# Patient Record
Sex: Male | Born: 1965
Health system: Southern US, Community
[De-identification: ages and names within clinical notes are randomized; demographics above are authoritative.]

## PROBLEM LIST (undated history)

## (undated) DIAGNOSIS — J302 Other seasonal allergic rhinitis: Secondary | ICD-10-CM

## (undated) DIAGNOSIS — I10 Essential (primary) hypertension: Secondary | ICD-10-CM

## (undated) DIAGNOSIS — K219 Gastro-esophageal reflux disease without esophagitis: Secondary | ICD-10-CM

## (undated) DIAGNOSIS — T7840XA Allergy, unspecified, initial encounter: Secondary | ICD-10-CM

## (undated) DIAGNOSIS — H409 Unspecified glaucoma: Secondary | ICD-10-CM

## (undated) DIAGNOSIS — M1990 Unspecified osteoarthritis, unspecified site: Secondary | ICD-10-CM

## (undated) DIAGNOSIS — F419 Anxiety disorder, unspecified: Secondary | ICD-10-CM

## (undated) HISTORY — PX: WISDOM TOOTH EXTRACTION: SHX21

## (undated) HISTORY — DX: Allergy, unspecified, initial encounter: T78.40XA

## (undated) HISTORY — DX: Unspecified glaucoma: H40.9

## (undated) HISTORY — PX: SINUS ENDO W/FUSION: SHX777

---

## 2000-09-03 ENCOUNTER — Emergency Department (HOSPITAL_COMMUNITY): Admission: EM | Admit: 2000-09-03 | Discharge: 2000-09-03 | Payer: Self-pay | Admitting: Emergency Medicine

## 2000-09-07 ENCOUNTER — Encounter: Payer: Self-pay | Admitting: Orthopedic Surgery

## 2000-09-07 ENCOUNTER — Ambulatory Visit (HOSPITAL_COMMUNITY): Admission: RE | Admit: 2000-09-07 | Discharge: 2000-09-07 | Payer: Self-pay | Admitting: Orthopedic Surgery

## 2005-03-25 ENCOUNTER — Encounter: Admission: RE | Admit: 2005-03-25 | Discharge: 2005-03-25 | Payer: Self-pay | Admitting: Allergy and Immunology

## 2009-04-28 ENCOUNTER — Emergency Department (HOSPITAL_COMMUNITY): Admission: EM | Admit: 2009-04-28 | Discharge: 2009-04-28 | Payer: Self-pay | Admitting: Family Medicine

## 2009-09-28 ENCOUNTER — Emergency Department (HOSPITAL_COMMUNITY): Admission: EM | Admit: 2009-09-28 | Discharge: 2009-09-28 | Payer: Self-pay | Admitting: Family Medicine

## 2010-07-06 ENCOUNTER — Emergency Department (HOSPITAL_COMMUNITY): Admission: EM | Admit: 2010-07-06 | Discharge: 2010-07-06 | Payer: Self-pay | Admitting: Family Medicine

## 2011-01-24 LAB — POCT RAPID STREP A (OFFICE): Streptococcus, Group A Screen (Direct): NEGATIVE

## 2012-01-24 ENCOUNTER — Encounter (HOSPITAL_COMMUNITY): Payer: Self-pay | Admitting: *Deleted

## 2012-01-24 ENCOUNTER — Emergency Department (HOSPITAL_COMMUNITY): Payer: 59

## 2012-01-24 ENCOUNTER — Emergency Department (HOSPITAL_COMMUNITY)
Admission: EM | Admit: 2012-01-24 | Discharge: 2012-01-24 | Disposition: A | Discharge status: Home / Self Care | Payer: 59 | Attending: Emergency Medicine | Admitting: Emergency Medicine

## 2012-01-24 DIAGNOSIS — R220 Localized swelling, mass and lump, head: Secondary | ICD-10-CM | POA: Insufficient documentation

## 2012-01-24 DIAGNOSIS — X58XXXA Exposure to other specified factors, initial encounter: Secondary | ICD-10-CM | POA: Insufficient documentation

## 2012-01-24 DIAGNOSIS — Z79899 Other long term (current) drug therapy: Secondary | ICD-10-CM | POA: Insufficient documentation

## 2012-01-24 DIAGNOSIS — I10 Essential (primary) hypertension: Secondary | ICD-10-CM | POA: Insufficient documentation

## 2012-01-24 DIAGNOSIS — R070 Pain in throat: Secondary | ICD-10-CM | POA: Insufficient documentation

## 2012-01-24 DIAGNOSIS — R1310 Dysphagia, unspecified: Secondary | ICD-10-CM | POA: Insufficient documentation

## 2012-01-24 DIAGNOSIS — M542 Cervicalgia: Secondary | ICD-10-CM | POA: Insufficient documentation

## 2012-01-24 DIAGNOSIS — T783XXA Angioneurotic edema, initial encounter: Secondary | ICD-10-CM

## 2012-01-24 HISTORY — DX: Essential (primary) hypertension: I10

## 2012-01-24 LAB — DIFFERENTIAL
Eosinophils Absolute: 0.3 10*3/uL (ref 0.0–0.7)
Lymphocytes Relative: 31 % (ref 12–46)
Lymphs Abs: 1.8 10*3/uL (ref 0.7–4.0)
Monocytes Relative: 8 % (ref 3–12)
Neutrophils Relative %: 55 % (ref 43–77)

## 2012-01-24 LAB — POCT I-STAT, CHEM 8
BUN: 12 mg/dL (ref 6–23)
Chloride: 106 mEq/L (ref 96–112)
Creatinine, Ser: 0.7 mg/dL (ref 0.50–1.35)
Glucose, Bld: 104 mg/dL — ABNORMAL HIGH (ref 70–99)
Hemoglobin: 15 g/dL (ref 13.0–17.0)
Potassium: 4 mEq/L (ref 3.5–5.1)
Sodium: 143 mEq/L (ref 135–145)

## 2012-01-24 LAB — CBC
Hemoglobin: 14.6 g/dL (ref 13.0–17.0)
MCH: 29.6 pg (ref 26.0–34.0)
Platelets: 201 10*3/uL (ref 150–400)
RBC: 4.93 MIL/uL (ref 4.22–5.81)
WBC: 5.7 10*3/uL (ref 4.0–10.5)

## 2012-01-24 MED ORDER — AMPICILLIN-SULBACTAM SODIUM 3 (2-1) G IJ SOLR
3.0000 g | Freq: Once | INTRAMUSCULAR | Status: AC
  Administered 2012-01-24: 3 g via INTRAVENOUS
  Filled 2012-01-24: qty 3

## 2012-01-24 MED ORDER — FAMOTIDINE 20 MG PO TABS: 20.0000 mg | ORAL_TABLET | Freq: Two times a day (BID) | ORAL | Status: DC

## 2012-01-24 MED ORDER — SODIUM CHLORIDE 0.9 % IV BOLUS (SEPSIS)
1000.0000 mL | Freq: Once | INTRAVENOUS | Status: AC
  Administered 2012-01-24: 1000 mL via INTRAVENOUS

## 2012-01-24 MED ORDER — IOHEXOL 300 MG/ML  SOLN
75.0000 mL | Freq: Once | INTRAMUSCULAR | Status: AC | PRN
  Administered 2012-01-24: 75 mL via INTRAVENOUS

## 2012-01-24 MED ORDER — PREDNISONE 10 MG PO TABS: 40.0000 mg | ORAL_TABLET | Freq: Every day | ORAL | Status: DC

## 2012-01-24 MED ORDER — FAMOTIDINE IN NACL 20-0.9 MG/50ML-% IV SOLN
20.0000 mg | Freq: Once | INTRAVENOUS | Status: AC
  Administered 2012-01-24: 20 mg via INTRAVENOUS
  Filled 2012-01-24: qty 50

## 2012-01-24 MED ORDER — ACETAMINOPHEN 325 MG PO TABS
650.0000 mg | ORAL_TABLET | Freq: Once | ORAL | Status: AC
  Administered 2012-01-24: 650 mg via ORAL
  Filled 2012-01-24: qty 2

## 2012-01-24 MED ORDER — DEXAMETHASONE SODIUM PHOSPHATE 10 MG/ML IJ SOLN
10.0000 mg | Freq: Once | INTRAMUSCULAR | Status: AC
  Administered 2012-01-24: 10 mg via INTRAVENOUS
  Filled 2012-01-24: qty 1

## 2012-01-24 NOTE — ED Provider Notes (Signed)
Medical screening examination/treatment/procedure(s) were conducted as a shared visit with non-physician practitioner(s) and myself.  I personally evaluated the patient during the encounter  Cheri Guppy, MD 01/24/12 [phone]

## 2012-01-24 NOTE — ED Provider Notes (Signed)
Medical screening examination/treatment/procedure(s) were conducted as a shared visit with non-physician practitioner(s) and myself.  I personally evaluated the patient during the encounter 46 year old, male, complains of sore throat, and dysphagia, since yesterday.  He has not had a fever.  No cough, or respiratory distress.  On examination.  His oropharynx is clear with no signs of exudate or edema.  CT is negative.  While he sitting up.  He does not have any symptoms but when he laid down.  She feels as if he is unable to breathe.  He has a very fat, neck.  This may be a component of obstruction due to the weight of his neck.  We will continue to monitor him in the CDU.  Cheri Guppy, MD 01/24/12 [phone]

## 2012-01-24 NOTE — ED Notes (Signed)
Patient transported to CT 

## 2012-01-24 NOTE — Discharge Instructions (Signed)
Angioedema Angioedema (AE) is a sudden swelling of the eyelids, lips, lobes of ears, external genitalia, skin, and other parts of the body. AE can happen by itself. It usually begins during the night and is found on awakening. It can happen with hives and other allergic reactions. Attacks can be mild and annoying, or life-threatening if the air passages swell. AE generally occurs in a short time period (over minutes to hours) and gets better in 24 to 48 hours. It usually does not cause any serious problems.  There are 2 different kinds of AE:   Allergic AE.   Nonallergic AE.   There may be an overreaction or direct stimulation of cells that are a part of the immune system (mast cells).   There may be problems with the release of chemicals made by the body that cause swelling and inflammation (kinins). AE due to kinins can be inherited from parents (hereditary), or it can develop on its own (acquired). Acquired AE either shows up before, or along with, certain diseases or is due to the body's immune system attacking parts of the body's own cells (autoimmune).  CAUSES  Allergic  AE due to allergic reactions are caused by something that causes the body to react (trigger). Common triggers include:   Foods.   Medicines.   Latex.   Direct contact with certain fruits, vegetables, or animal saliva.   Insect stings.  Nonallergic  Mast cell stimulation may be caused by:   Medicines.   Dyes used in X-rays.   The body's own immune system reactions to parts of the body (autoimmune disease).   Possibly, some virus infections.   AE due to problems with kinins can be hereditary or acquired. Attacks are triggered by:   Mild injury.   Dental work or any surgery.   Stress.   Sudden changes in temperature.   Exercise.   Medicines.   AE due to problems with kinins can also be due to certain medicines, especially blood pressure medicines like angiotensin-converting enzyme (ACE)  inhibitors. African Americans are at nearly 5 times greater risk of developing AE than Caucasians from ACE inhibitors.  SYMPTOMS  Allergic symptoms:  Non-itchy swelling of the skin. Often the swelling is on the face and lips, but any area of the skin can swell. Sometimes, the swelling can be painful. If hives are present, there is intense itching.   Breathing problems if the air passages swell.  Nonallergic symptoms:  If internal organs are involved, there may be:   Nausea.   Abdominal pain.   Vomiting.   Difficulty swallowing.   Difficulty passing urine.   Breathing problems if the air passages swell.  Depending on the cause of AE, episodes may:  Only happen once (if triggers are removed or avoided).   Come back in unpredictable patterns.   Repeat for several years and then gradually fade away.  DIAGNOSIS  AE is diagnosed by:   Asking questions to find out how fast the symptoms began.   Taking a family history.   Physical exam.   Diagnostic tests. Tests could include:   Allergy skin tests to see if the problem is allergic.   Blood tests to diagnose hereditary and some acquired types of AE.   Other tests to see if there is a hidden disease leading to the AE.  TREATMENT  Treatment depends on the type and cause (if any) of the AE. Allergic  Allergic types of AE are treated with:   Immediate removal of   the trigger or medicine (if any).   Epinephrine injection.   Steroids.   Antihistamines.   Hospitalization for severe attacks.  Nonallergic  Mast cell stimulation types of AE are treated with:   Immediate removal of the trigger or medicine (if any).   Epinephrine injection.   Steroids.   Antihistamines.   Hospitalization for severe attacks.   Hereditary AE is treated with:   Medicines to prevent and treat attacks. There is little response to antihistamines, epinephrine, or steroids.   Preventive medicines before dental work or surgery.    Removing or avoiding medicines that trigger attacks.   Hospitalization for severe attacks.   Acquired AE is treated with:   Treating underlying disease (if any).   Medicines to prevent and treat attacks.  HOME CARE INSTRUCTIONS   Always carry your emergency allergy treatment medicines with you.   Wear a medical bracelet.   Avoid known triggers.  SEEK MEDICAL CARE IF:   You get repeat attacks.   Your attacks are more frequent or more severe despite preventive measures.   You have hereditary AE and are considering having children. It is important to discuss the risks of passing this on to your children.  SEEK IMMEDIATE MEDICAL CARE IF:   You have difficulty breathing.   You have difficulty swallowing.   You experience fainting.  This condition should be treated immediately. It can be life-threatening if it involves throat swelling. Document Released: 01/05/2002 Document Revised: 10/16/2011 Document Reviewed: 10/26/2008 [employer] Patient Information 2012 [employer]. 

## 2012-01-24 NOTE — ED Notes (Signed)
Pt reporting feeling difficulty with breathing when laying back. Face appearing swollen. Pt eating per EDP, no difficulty. Airway intact. Maintaining oxygen saturation 100% RA. Speaking in clear and concise sentences.

## 2012-01-24 NOTE — ED Provider Notes (Signed)
Medical screening examination/treatment/procedure(s) were conducted as a shared visit with non-physician practitioner(s) and myself.  I personally evaluated the patient during the encounter  Cheri Guppy, MD 01/24/12 1351

## 2012-01-24 NOTE — ED Notes (Signed)
Pt provided water.  

## 2012-01-24 NOTE — ED Notes (Addendum)
Pt reporting last night ate seafood (shrimp, flounder), denying any hx of reaction to sea food. Also reporting tried blueberry moonshine last night for the first time. Woke up this morning at 0100 with sore throat, noticed uvula was swollen, Reporting painful swallowing, feeling like difficult to breathe.Throat red, uvula swollen. Swelling to area in front of ears. No swelling of lymph nodes noted.  Airway intact, a & o x 4. 98% RA. No other complaints at this time.

## 2012-01-24 NOTE — ED Notes (Signed)
Throat swollen since midnight.  No other symptoms

## 2012-01-24 NOTE — ED Provider Notes (Addendum)
History     CSN: [id number]  Arrival date & time 01/24/12  4782   First MD Initiated Contact with Patient 01/24/12 0719      Chief Complaint  Patient presents with  . Sore Throat    (Consider location/radiation/quality/duration/timing/severity/associated sxs/prior treatment) HPI Comments: Patient here with acute onset of throat swelling since 0100 last night - he states that the throat is somewhat sore but denies fever, chills, headache, swollen glands.  Is able to swallow but reports pain with swallowing.  Patient is a 46 y.o. male presenting with pharyngitis. The history is provided by the patient. No language interpreter was used.  Sore Throat This is a new problem. The current episode started today. The problem occurs constantly. The problem has been gradually worsening. Associated symptoms include neck pain and a sore throat. Pertinent negatives include no abdominal pain, anorexia, arthralgias, change in bowel habit, chest pain, chills, congestion, coughing, diaphoresis, fatigue, fever, headaches, joint swelling, myalgias, nausea, numbness, rash, swollen glands, urinary symptoms, vertigo, visual change, vomiting or weakness. The symptoms are aggravated by swallowing. He has tried nothing for the symptoms. The treatment provided no relief.    Past Medical History  Diagnosis Date  . Hypertension     History reviewed. No pertinent past surgical history.  History reviewed. No pertinent family history.  History  Substance Use Topics  . Smoking status: Never Smoker   . Smokeless tobacco: Not on file  . Alcohol Use: Yes      Review of Systems  Constitutional: Negative for fever, chills, diaphoresis and fatigue.  HENT: Positive for sore throat and neck pain. Negative for congestion, trouble swallowing and neck stiffness.   Respiratory: Negative for cough.   Cardiovascular: Negative for chest pain.  Gastrointestinal: Negative for nausea, vomiting, abdominal pain, anorexia and  change in bowel habit.  Musculoskeletal: Negative for myalgias, joint swelling and arthralgias.  Skin: Negative for rash.  Neurological: Negative for vertigo, weakness, numbness and headaches.    Allergies  Review of patient's allergies indicates no known allergies.  Home Medications   Current Outpatient Rx  Name Route Sig Dispense Refill  . OCUVITE PO TABS Oral Take 1 tablet by mouth daily.    Marland Kitchen CETIRIZINE HCL 10 MG PO TABS Oral Take 10 mg by mouth daily.    . IBUPROFEN 200 MG PO TABS Oral Take 800 mg by mouth every 6 (six) hours as needed. For pain    . LISINOPRIL 40 MG PO TABS Oral Take 40 mg by mouth daily.    . OXYCODONE HCL 5 MG PO TABS Oral Take 5 mg by mouth every 4 (four) hours as needed. For back pain      BP 168/94  Pulse 97  Temp(Src) 97.6 F (36.4 C) (Oral)  Resp 20  SpO2 99%  Physical Exam  Nursing note and vitals reviewed. Constitutional: He is oriented to person, place, and time. He appears well-developed and well-nourished. No distress.  HENT:  Head: Normocephalic and atraumatic.  Right Ear: External ear normal.  Left Ear: External ear normal.  Nose: Nose normal.  Mouth/Throat: Mucous membranes are normal. Posterior oropharyngeal edema present. No oropharyngeal exudate.    Neck: Normal range of motion. Neck supple. No tracheal deviation present.  Cardiovascular: Normal rate, regular rhythm and normal heart sounds.  Exam reveals no gallop and no friction rub.   No murmur heard. Pulmonary/Chest: Effort normal and breath sounds normal. No stridor. No respiratory distress. He has no wheezes. He has no rales. He exhibits  no tenderness.  Abdominal: Soft. Bowel sounds are normal. He exhibits no distension. There is no tenderness. There is no rebound and no guarding.  Musculoskeletal: Normal range of motion. He exhibits no edema and no tenderness.  Lymphadenopathy:    He has no cervical adenopathy.  Neurological: He is alert and oriented to person, place, and  time. No cranial nerve deficit.  Skin: Skin is warm and dry. No rash noted. No erythema. No pallor.  Psychiatric: He has a normal mood and affect. His behavior is normal. Judgment and thought content normal.    ED Course  Procedures (including critical care time)   Labs Reviewed  CBC  DIFFERENTIAL   No results found.  Results for orders placed during the hospital encounter of 01/24/12  CBC      Component Value Range   WBC 5.7  4.0 - 10.5 (K/uL)   RBC 4.93  4.22 - 5.81 (MIL/uL)   Hemoglobin 14.6  13.0 - 17.0 (g/dL)   HCT 54.0  98.1 - 19.1 (%)   MCV 83.8  78.0 - 100.0 (fL)   MCH 29.6  26.0 - 34.0 (pg)   MCHC 35.4  30.0 - 36.0 (g/dL)   RDW 47.8  29.5 - 62.1 (%)   Platelets 201  150 - 400 (K/uL)  DIFFERENTIAL      Component Value Range   Neutrophils Relative 55  43 - 77 (%)   Neutro Abs 3.1  1.7 - 7.7 (K/uL)   Lymphocytes Relative 31  12 - 46 (%)   Lymphs Abs 1.8  0.7 - 4.0 (K/uL)   Monocytes Relative 8  3 - 12 (%)   Monocytes Absolute 0.5  0.1 - 1.0 (K/uL)   Eosinophils Relative 5  0 - 5 (%)   Eosinophils Absolute 0.3  0.0 - 0.7 (K/uL)   Basophils Relative 1  0 - 1 (%)   Basophils Absolute 0.1  0.0 - 0.1 (K/uL)  POCT I-STAT, CHEM 8      Component Value Range   Sodium 143  135 - 145 (mEq/L)   Potassium 4.0  3.5 - 5.1 (mEq/L)   Chloride 106  96 - 112 (mEq/L)   BUN 12  6 - 23 (mg/dL)   Creatinine, Ser 3.08  0.50 - 1.35 (mg/dL)   Glucose, Bld 657 (*) 70 - 99 (mg/dL)   Calcium, Ion 8.46  9.62 - 1.32 (mmol/L)   TCO2 23  0 - 100 (mmol/L)   Hemoglobin 15.0  13.0 - 17.0 (g/dL)   HCT 95.2  84.1 - 32.4 (%)  RAPID STREP SCREEN      Component Value Range   Streptococcus, Group A Screen (Direct) NEGATIVE  NEGATIVE    Ct Soft Tissue Neck W Contrast  01/24/2012  *RADIOLOGY REPORT*  Clinical Data: Sudden onset of severe throat pain and swelling.  CT NECK WITH CONTRAST  Technique:  Multidetector CT imaging of the neck was performed with intravenous contrast.  Contrast: 75mL  OMNIPAQUE IOHEXOL 300 MG/ML IJ SOLN  Comparison: None.  Findings: No focal mucosal or submucosal lesions are present. Calcifications are present within the palatine tonsils bilaterally. The airway is patent.  Bilateral level II lymph nodes are within normal limits for size. These are likely reactive.  No pathologic adenopathy is present in the neck.  The thyroid is within normal limits.  No other focal soft tissue abnormalities are evident.  Limited imaging of the brain is unremarkable.  The bone windows demonstrate mild uncovertebral disease from C3-4 through C6-7.  C6-7 is the most significant level with loss of disc height and mild osseous foraminal narrowing bilaterally.  The lung apices are clear.  IMPRESSION:  1.  No acute or focal abnormality to explain the patient's symptoms. 2.  Calcifications within the palatine tonsils bilaterally are compatible with prior infection. 3.  Reactive type adenopathy at the level II station bilaterally. 4.  Mild spondylosis of the cervical spine, most pronounced at C6- 7.  Original Report Authenticated By: Jamesetta Orleans. MATTERN, M.D.     Angioedema    MDM  Patient with mild improvement in symptoms - I believe this to be angioedema, will continue to watch the patient as he still complains of sensation of throat swelling when he lies flat.        Izola Price [place], [place] 01/24/12 1300  Patient continues with relief of symptoms - would like to be discharged home.  Have spoken with Dr. Weldon Inches and talked with the patient and his wife who knows strict return precautions.  Izola Price [place], [place] 01/24/12 1537

## 2012-11-08 ENCOUNTER — Emergency Department (HOSPITAL_COMMUNITY)
Admission: EM | Admit: 2012-11-08 | Discharge: 2012-11-08 | Disposition: A | Discharge status: Home / Self Care | Payer: 59 | Attending: Emergency Medicine | Admitting: Emergency Medicine

## 2012-11-08 ENCOUNTER — Emergency Department (HOSPITAL_COMMUNITY): Payer: 59

## 2012-11-08 ENCOUNTER — Encounter (HOSPITAL_COMMUNITY): Payer: Self-pay | Admitting: *Deleted

## 2012-11-08 DIAGNOSIS — Z79899 Other long term (current) drug therapy: Secondary | ICD-10-CM | POA: Insufficient documentation

## 2012-11-08 DIAGNOSIS — I10 Essential (primary) hypertension: Secondary | ICD-10-CM | POA: Insufficient documentation

## 2012-11-08 DIAGNOSIS — R221 Localized swelling, mass and lump, neck: Secondary | ICD-10-CM

## 2012-11-08 DIAGNOSIS — R220 Localized swelling, mass and lump, head: Secondary | ICD-10-CM | POA: Insufficient documentation

## 2012-11-08 LAB — POCT I-STAT, CHEM 8
BUN: 5 mg/dL — ABNORMAL LOW (ref 6–23)
Calcium, Ion: 1.11 mmol/L — ABNORMAL LOW (ref 1.12–1.23)
Chloride: 109 mEq/L (ref 96–112)
Creatinine, Ser: 0.8 mg/dL (ref 0.50–1.35)
Glucose, Bld: 110 mg/dL — ABNORMAL HIGH (ref 70–99)
TCO2: 22 mmol/L (ref 0–100)

## 2012-11-08 LAB — CBC
HCT: 43.5 % (ref 39.0–52.0)
Hemoglobin: 14.8 g/dL (ref 13.0–17.0)
MCH: 28.3 pg (ref 26.0–34.0)
MCV: 83.2 fL (ref 78.0–100.0)
Platelets: 203 10*3/uL (ref 150–400)
RBC: 5.23 MIL/uL (ref 4.22–5.81)
WBC: 6.3 10*3/uL (ref 4.0–10.5)

## 2012-11-08 MED ORDER — DIPHENHYDRAMINE HCL 50 MG/ML IJ SOLN
25.0000 mg | Freq: Once | INTRAMUSCULAR | Status: AC
  Administered 2012-11-08: 25 mg via INTRAVENOUS
  Filled 2012-11-08: qty 1

## 2012-11-08 MED ORDER — PREDNISONE 20 MG PO TABS: 60.0000 mg | ORAL_TABLET | Freq: Every day | ORAL | Status: DC

## 2012-11-08 MED ORDER — METHYLPREDNISOLONE SODIUM SUCC 125 MG IJ SOLR
125.0000 mg | Freq: Once | INTRAMUSCULAR | Status: AC
  Administered 2012-11-08: 125 mg via INTRAVENOUS
  Filled 2012-11-08: qty 2

## 2012-11-08 MED ORDER — FAMOTIDINE IN NACL 20-0.9 MG/50ML-% IV SOLN
20.0000 mg | Freq: Once | INTRAVENOUS | Status: AC
  Administered 2012-11-08: 20 mg via INTRAVENOUS
  Filled 2012-11-08: qty 50

## 2012-11-08 NOTE — ED Notes (Signed)
Patient transported to X-ray 

## 2012-11-08 NOTE — ED Notes (Signed)
Difficulty breathing when attempting to go to asleep.  Unable to rest.  History of the same  From medication.  No new pain

## 2012-11-08 NOTE — ED Provider Notes (Signed)
History     CSN: [id number]  Arrival date & time 11/08/12  0227   First MD Initiated Contact with Patient 11/08/12 0234      Chief Complaint  Patient presents with  . difficulty breathing     (Consider location/radiation/quality/duration/timing/severity/associated sxs/prior treatment) HPI Hx per PT.   AT home tonight, going to bed and noticed throat swelling, feels like it is hard to breath.  Has h/o same a few months ago and was taken off of lisinopril, required ED visit and medications with condition improved. Tonight no associated itching or rash, similar to previous episode. No CP or SOB otherwise. No wheezing, no FH of angioedema.  No sore throat, cough, fever or recent illness. Mod in severity. No new meds or new exposures.   Past Medical History  Diagnosis Date  . Hypertension     History reviewed. No pertinent past surgical history.  No family history on file.  History  Substance Use Topics  . Smoking status: Never Smoker   . Smokeless tobacco: Not on file  . Alcohol Use: Yes      Review of Systems  Constitutional: Negative for fever and chills.  HENT: Negative for mouth sores, neck pain, neck stiffness, dental problem, voice change and sinus pressure.   Eyes: Negative for pain.  Respiratory: Negative for shortness of breath.   Cardiovascular: Negative for chest pain.  Gastrointestinal: Negative for abdominal pain.  Genitourinary: Negative for dysuria.  Musculoskeletal: Negative for back pain.  Skin: Negative for rash.  Neurological: Negative for syncope and headaches.  All other systems reviewed and are negative.    Allergies  Lisinopril  Home Medications   Current Outpatient Rx  Name  Route  Sig  Dispense  Refill  . AMLODIPINE BESYLATE 2.5 MG PO TABS   Oral   Take 2.5 mg by mouth daily.         . AZELASTINE HCL 137 MCG/SPRAY NA SOLN   Nasal   Place 1 spray into the nose 2 (two) times daily. Use in each nostril as directed         .  DIHYDROXYALUMINUM SOD CARB 334 MG PO CHEW   Oral   Chew 1 tablet by mouth 2 (two) times daily as needed. For acid reflux         . FLUTICASONE PROPIONATE 50 MCG/ACT NA SUSP   Nasal   Place 2 sprays into the nose daily.         . IBUPROFEN 200 MG PO TABS   Oral   Take 800 mg by mouth every 6 (six) hours as needed. For pain         . OXYCODONE HCL 5 MG PO TABS   Oral   Take 5 mg by mouth every 4 (four) hours as needed. For back pain         . SIMETHICONE 80 MG PO CHEW   Oral   Chew 80 mg by mouth every 6 (six) hours as needed. For acid reflux           BP 134/95  Pulse 66  Temp 98.2 F (36.8 C) (Oral)  Resp 20  SpO2 99%  Physical Exam  Constitutional: He is oriented to person, place, and time. He appears well-developed and well-nourished.  HENT:  Head: Normocephalic and atraumatic.  Mouth/Throat: Oropharynx is clear and moist. No oropharyngeal exudate.       mildly enlarged tonsils no exudates, uvula midline  Eyes: Conjunctivae normal and EOM are normal. Pupils  are equal, round, and reactive to light.  Neck: Trachea normal. Neck supple. No tracheal deviation present.  Cardiovascular: Normal rate, regular rhythm, S1 normal, S2 normal and normal pulses.     No systolic murmur is present   No diastolic murmur is present  Pulses:      Radial pulses are 2+ on the right side, and 2+ on the left side.  Pulmonary/Chest: Effort normal and breath sounds normal. No stridor. He has no wheezes. He has no rhonchi. He has no rales. He exhibits no tenderness.  Abdominal: Soft. Normal appearance and bowel sounds are normal. There is no tenderness. There is no CVA tenderness and negative Murphy's sign.  Musculoskeletal:       BLE:s Calves nontender, no cords or erythema, negative Homans sign  Lymphadenopathy:    He has no cervical adenopathy.  Neurological: He is alert and oriented to person, place, and time. He has normal strength. No cranial nerve deficit or sensory deficit.  GCS eye subscore is 4. GCS verbal subscore is 5. GCS motor subscore is 6.  Skin: Skin is warm and dry. No rash noted. He is not diaphoretic.  Psychiatric: His speech is normal.       Cooperative and appropriate    ED Course  Procedures (including critical care time)  Labs Reviewed  POCT I-STAT, CHEM 8 - Abnormal; Notable for the following:    BUN 5 (*)     Glucose, Bld 110 (*)     Calcium, Ion 1.11 (*)     All other components within normal limits  CBC   Dg Neck Soft Tissue  11/08/2012  *RADIOLOGY REPORT*  Clinical Data: Throat swelling and shortness of breath.  NECK SOFT TISSUES - 1+ VIEW  Comparison: CT of the soft tissues of the neck performed 01/24/2012  Findings: The nasopharynx, oropharynx and hypopharynx are unremarkable in appearance.  The epiglottis is normal in thickness. The aryepiglottic folds are unremarkable.  Prevertebral soft tissues are within normal limits.  The proximal trachea is unremarkable.  No acute osseous abnormalities are identified.  Minimal degenerative change is noted at the lower cervical spine.  The visualized paranasal sinuses and mastoid air cells are well- aerated.  IMPRESSION: Unremarkable radiograph of the soft tissues of the neck.   Original Report Authenticated By: Tonia Ghent, M.D.    Dg Chest 2 View  11/08/2012  *RADIOLOGY REPORT*  Clinical Data: Shortness of breath; no constricting.  CHEST - 2 VIEW  Comparison: None.  Findings: The lungs are well-aerated and clear.  There is no evidence of focal opacification, pleural effusion or pneumothorax.  The heart is normal in size; the mediastinal contour is within normal limits.  No acute osseous abnormalities are seen.  IMPRESSION: No acute cardiopulmonary process seen.   Original Report Authenticated By: Tonia Ghent, M.D.      Date: 11/08/2012  Rate: 79  Rhythm: normal sinus rhythm  QRS Axis: normal  Intervals: normal  ST/T Wave abnormalities: nonspecific ST changes  Conduction  Disutrbances:none  Narrative Interpretation:   Old EKG Reviewed: none available  IV solumedrol, benadryl and pepcid and ED observation  6:30 AM feeling sig better, tolerating POs, wants to go home. Plan f/u PCP. Plan stop amlodipine and followup primary care physician. Patient inquired about possible ENT followup and referral provided. He is a reliable historian and agrees to strict return precautions for any return of symptoms or worsening condition.  MDM   Throat swelling with no visualized angioedema on exam. No stridor or  wheezing. Has history of same while on an ACE inhibitor. Medications reviewed and does take Norvasc. No infectious symptoms otherwise. No rash or itching or evidence of allergic reaction otherwise. Patient observed after medications and condition much improved.           Sunnie Nielsen, MD 11/08/12 [phone]

## 2012-11-09 ENCOUNTER — Emergency Department (HOSPITAL_COMMUNITY)
Admission: EM | Admit: 2012-11-09 | Discharge: 2012-11-09 | Disposition: A | Discharge status: Home / Self Care | Payer: 59 | Attending: Emergency Medicine | Admitting: Emergency Medicine

## 2012-11-09 ENCOUNTER — Encounter (HOSPITAL_COMMUNITY): Payer: Self-pay | Admitting: *Deleted

## 2012-11-09 DIAGNOSIS — Z79899 Other long term (current) drug therapy: Secondary | ICD-10-CM | POA: Insufficient documentation

## 2012-11-09 DIAGNOSIS — R51 Headache: Secondary | ICD-10-CM

## 2012-11-09 DIAGNOSIS — I10 Essential (primary) hypertension: Secondary | ICD-10-CM

## 2012-11-09 LAB — POCT I-STAT, CHEM 8
BUN: 12 mg/dL (ref 6–23)
Creatinine, Ser: 0.9 mg/dL (ref 0.50–1.35)
Glucose, Bld: 96 mg/dL (ref 70–99)
Hemoglobin: 16.7 g/dL (ref 13.0–17.0)
Sodium: 141 mEq/L (ref 135–145)
TCO2: 28 mmol/L (ref 0–100)

## 2012-11-09 MED ORDER — DIPHENHYDRAMINE HCL 50 MG/ML IJ SOLN
25.0000 mg | Freq: Once | INTRAMUSCULAR | Status: DC
  Filled 2012-11-09: qty 1

## 2012-11-09 MED ORDER — SODIUM CHLORIDE 0.9 % IV BOLUS (SEPSIS): 1000.0000 mL | Freq: Once | INTRAVENOUS | Status: DC

## 2012-11-09 MED ORDER — DEXAMETHASONE SODIUM PHOSPHATE 10 MG/ML IJ SOLN
10.0000 mg | Freq: Once | INTRAMUSCULAR | Status: DC
  Filled 2012-11-09: qty 1

## 2012-11-09 MED ORDER — ACETAMINOPHEN 325 MG PO TABS
650.0000 mg | ORAL_TABLET | Freq: Once | ORAL | Status: AC
  Administered 2012-11-09: 650 mg via ORAL
  Filled 2012-11-09: qty 2

## 2012-11-09 MED ORDER — PROCHLORPERAZINE EDISYLATE 5 MG/ML IJ SOLN
10.0000 mg | Freq: Once | INTRAMUSCULAR | Status: DC
  Filled 2012-11-09: qty 2

## 2012-11-09 NOTE — ED Notes (Signed)
PT state that he has been off bp medication for 2 days because they thought he was having an allergic reaction to it.  Pt states worsening headache since 11am and no other symptoms.  PT reports anxious

## 2012-11-09 NOTE — ED Notes (Signed)
Pt does not want to receive NS bolus

## 2012-11-09 NOTE — ED Provider Notes (Signed)
History     CSN: [id number]  Arrival date & time 11/09/12  1622   None     Chief Complaint  Patient presents with  . Headache  . Hypertension     HPI chief complaint: Headache. Onset: 2 days ago. Location: Global. Has not worsened or improved by anything. Severity not. Timing constant. Duration: 2 days. No associated vision changes, hearing loss, vomiting, dizziness, weakness, numbness, gait abnormalities. Regarding social history see nurse's notes. I have reviewed the patient's past medical, past surgical past social history as well as medications and allergies per  Past Medical History  Diagnosis Date  . Hypertension     History reviewed. No pertinent past surgical history.  No family history on file.  History  Substance Use Topics  . Smoking status: Never Smoker   . Smokeless tobacco: Not on file  . Alcohol Use: Yes      Review of Systems 10 Systems reviewed and are negative for acute change except as noted in the HPI.  Allergies  Lisinopril  Home Medications   Current Outpatient Rx  Name  Route  Sig  Dispense  Refill  . AMLODIPINE BESYLATE 2.5 MG PO TABS   Oral   Take 2.5 mg by mouth daily.         . AZELASTINE HCL 137 MCG/SPRAY NA SOLN   Nasal   Place 2 sprays into the nose 2 (two) times daily. Use in each nostril as directed         . DIHYDROXYALUMINUM SOD CARB 334 MG PO CHEW   Oral   Chew 1 tablet by mouth 2 (two) times daily as needed. For acid reflux         . FLUTICASONE PROPIONATE 50 MCG/ACT NA SUSP   Nasal   Place 2 sprays into the nose daily.         . IBUPROFEN 200 MG PO TABS   Oral   Take 800 mg by mouth every 6 (six) hours as needed. For pain         . LATANOPROST 0.005 % OP SOLN      1 drop at bedtime.         Marland Kitchen EYE VITAMINS PO CAPS   Oral   Take 2 capsules by mouth daily.         . OXYCODONE HCL 5 MG PO TABS   Oral   Take 5 mg by mouth every 4 (four) hours as needed. For back pain         . PREDNISONE 20  MG PO TABS   Oral   Take 60 mg by mouth daily.         Marland Kitchen SIMETHICONE 80 MG PO CHEW   Oral   Chew 80 mg by mouth every 6 (six) hours as needed. For acid reflux           BP 174/108  Pulse 88  Temp 97.9 F (36.6 C) (Oral)  Resp 18  SpO2 98%  Physical Exam  Constitutional: He is oriented to person, place, and time. He appears well-developed and well-nourished. No distress.  HENT:  Head: Normocephalic.  Eyes: Conjunctivae normal are normal.  Neck: Normal range of motion. Neck supple.  Cardiovascular: Normal rate, regular rhythm, normal heart sounds and intact distal pulses.   No murmur heard. Pulmonary/Chest: Effort normal and breath sounds normal. No respiratory distress.  Abdominal: Soft. Bowel sounds are normal. He exhibits no distension. There is no tenderness.  Musculoskeletal: Normal range of  motion. He exhibits no edema and no tenderness.  Neurological: He is alert and oriented to person, place, and time. He has normal strength. No cranial nerve deficit or sensory deficit. Coordination and gait normal. GCS eye subscore is 4. GCS verbal subscore is 5. GCS motor subscore is 6.  Skin: Skin is warm and dry. He is not diaphoretic.  Psychiatric: He has a normal mood and affect.    ED Course  Procedures (including critical care time)  Labs Reviewed  POCT I-STAT, CHEM 8 - Abnormal; Notable for the following:    Calcium, Ion 1.29 (*)     All other components within normal limits   Dg Neck Soft Tissue  11/08/2012  *RADIOLOGY REPORT*  Clinical Data: Throat swelling and shortness of breath.  NECK SOFT TISSUES - 1+ VIEW  Comparison: CT of the soft tissues of the neck performed 01/24/2012  Findings: The nasopharynx, oropharynx and hypopharynx are unremarkable in appearance.  The epiglottis is normal in thickness. The aryepiglottic folds are unremarkable.  Prevertebral soft tissues are within normal limits.  The proximal trachea is unremarkable.  No acute osseous abnormalities are  identified.  Minimal degenerative change is noted at the lower cervical spine.  The visualized paranasal sinuses and mastoid air cells are well- aerated.  IMPRESSION: Unremarkable radiograph of the soft tissues of the neck.   Original Report Authenticated By: Tonia Ghent, M.D.    Dg Chest 2 View  11/08/2012  *RADIOLOGY REPORT*  Clinical Data: Shortness of breath; no constricting.  CHEST - 2 VIEW  Comparison: None.  Findings: The lungs are well-aerated and clear.  There is no evidence of focal opacification, pleural effusion or pneumothorax.  The heart is normal in size; the mediastinal contour is within normal limits.  No acute osseous abnormalities are seen.  IMPRESSION: No acute cardiopulmonary process seen.   Original Report Authenticated By: Tonia Ghent, M.D.      1. Headache   2. Hypertension       MDM  Patient is a very well-appearing 39 male taken off his antihypertensive medication 2 days ago for reported allergic reaction who presents with 2 days of report of high blood pressure and headache. Systolic blood pressure noted to 170 here. Other vitals stable. Afebrile. No concern for meningitis or encephalitis by history or exam. No concern for infectious etiology. No reported history of trauma. No focal deficits on exam. Patient states his headache is mild and refused a migraine cocktail. Headache improved with Tylenol. Patient discharged home. Of note patient's blood pressure spontaneously improved to 130 systolic while in the emergency department. Patient referred back to PCP for hypertension management.        Consuello Masse, MD 11/09/12 [phone]

## 2012-12-06 NOTE — ED Provider Notes (Signed)
I saw and evaluated the patient, reviewed the resident's note and I agree with the findings and plan.  Hurman Horn, MD 12/06/12 [phone]

## 2014-09-22 ENCOUNTER — Telehealth: Payer: Self-pay | Admitting: Vascular Surgery

## 2014-09-22 NOTE — Telephone Encounter (Addendum)
-----   Message from Sherrye Payor, RN sent at 09/21/2014  1:14 PM EST ----- Regarding: needs Consult with Dr. Scot Dock Please schedule for consult appt. with Dr. Scot Dock on 09/27/14; sorry for the late notice, but have been waiting for this ALIF to get finalized by [facility]. It is actually scheduled for 11/19.  Please remind pt. To bring LS spine films to appt. with him.    09/22/14: spoke with pt and asked to bring films, dpm

## 2014-09-27 ENCOUNTER — Encounter: Payer: 59 | Admitting: Vascular Surgery

## 2014-10-09 ENCOUNTER — Encounter: Payer: Self-pay | Admitting: Vascular Surgery

## 2014-10-09 ENCOUNTER — Telehealth: Payer: Self-pay | Admitting: Vascular Surgery

## 2014-10-09 NOTE — Telephone Encounter (Addendum)
-----   Message from Sherrye Payor, RN sent at 10/09/2014  9:26 AM EST ----- Regarding: needs office consultation prior to ALIF This pt. was scheduled for ALIF originally on 11/19 and Dr. Scot Dock was to assist.  Due to insurance, the surgery date got changed, and now this is scheduled with Dr. Donnetta Hutching on 12/9.    Can we work him into Dr. Luther Parody schedule for consultation, prior to 12/9?  (Dr. Donnetta Hutching will be assisting Dr. Rolena Infante with the ALIF, instead of CSD.)   10/09/14: spoke with patient to schedule for 10/10/14, dpm

## 2014-10-10 ENCOUNTER — Encounter: Payer: Self-pay | Admitting: Vascular Surgery

## 2014-10-10 ENCOUNTER — Ambulatory Visit (INDEPENDENT_AMBULATORY_CARE_PROVIDER_SITE_OTHER): Payer: 59 | Admitting: Vascular Surgery

## 2014-10-10 VITALS — BP 144/97 | HR 86 | Ht 69.0 in | Wt 218.1 lb

## 2014-10-10 DIAGNOSIS — M5136 Other intervertebral disc degeneration, lumbar region: Secondary | ICD-10-CM

## 2014-10-10 NOTE — Progress Notes (Signed)
Patient name: Nathan [name] MRN: [id number] DOB: [birth year] Sex: male   Referred by: Rolena Infante  Reason for referral:  Chief Complaint  Patient presents with  . New Evaluation    eval prior to ALIF 12/09    HISTORY OF PRESENT ILLNESS: Patient is today for discussion of L5-S1 disc surgery with anterior approach with Dr. Rolena Infante and myself on 10/18/2014. He is a long history of progressive pain associated with the degenerative disc disease and evaluation with Dr. Rolena Infante as recommended anterior approach. He has had no prior abdominal surgery.  Past Medical History  Diagnosis Date  . Hypertension     Past Surgical History  Procedure Laterality Date  . Sinus endo w/fusion      History   Social History  . Marital Status: Married    Spouse Name: N/A    Number of Children: N/A  . Years of Education: N/A   Occupational History  . Not on file.   Social History Main Topics  . Smoking status: Never Smoker   . Smokeless tobacco: Never Used  . Alcohol Use: 4.8 oz/week    8 Cans of beer per week  . Drug Use: No  . Sexual Activity: Not on file   Other Topics Concern  . Not on file   Social History Narrative    Family History  Problem Relation Age of Onset  . Diabetes Mother   . Hypertension Mother   . Heart disease Father     BEFORE AGE 67  . Hypertension Father   . Heart attack Father   . Diabetes Sister   . Hypertension Sister     Allergies as of 10/10/2014 - Review Complete 10/10/2014  Allergen Reaction Noted  . Lisinopril Anaphylaxis 11/08/2012    Current Outpatient Prescriptions on File Prior to Visit  Medication Sig Dispense Refill  . amLODipine (NORVASC) 2.5 MG tablet Take 2.5 mg by mouth daily.    Marland Kitchen azelastine (ASTELIN) 137 MCG/SPRAY nasal spray Place 2 sprays into the nose 2 (two) times daily. Use in each nostril as directed    . calcium carbonate-magnesium hydroxide (ROLAIDS) 334 MG CHEW Chew 1 tablet by mouth 2 (two) times daily as needed. For  acid reflux    . fluticasone (FLONASE) 50 MCG/ACT nasal spray Place 2 sprays into the nose daily.    Marland Kitchen ibuprofen (ADVIL,MOTRIN) 200 MG tablet Take 800 mg by mouth every 6 (six) hours as needed. For pain    . latanoprost (XALATAN) 0.005 % ophthalmic solution 1 drop at bedtime.    . Multiple Vitamins-Minerals (EYE VITAMINS) CAPS Take 2 capsules by mouth daily.    Marland Kitchen oxyCODONE (OXY IR/ROXICODONE) 5 MG immediate release tablet Take 5 mg by mouth every 4 (four) hours as needed. For back pain    . simethicone (MYLICON) 80 MG chewable tablet Chew 80 mg by mouth every 6 (six) hours as needed. For acid reflux    . predniSONE (DELTASONE) 20 MG tablet Take 60 mg by mouth daily.     No current facility-administered medications on file prior to visit.     REVIEW OF SYSTEMS:  Positives indicated with an "X"  CARDIOVASCULAR:  [ ]  chest pain   [ ]  chest pressure   [ ]  palpitations   [ ]  orthopnea   [ ]  dyspnea on exertion   [ ]  claudication   [ ]  rest pain   [ ]  DVT   [ ]  phlebitis PULMONARY:   [ ]  productive cough   [ ]   asthma   [ ]  wheezing NEUROLOGIC:   [ ]  weakness  [x ] paresthesias  [ ]  aphasia  [ ]  amaurosis  [ ]  dizziness HEMATOLOGIC:   [ ]  bleeding problems   [ ]  clotting disorders MUSCULOSKELETAL:  [ ]  joint pain   [ ]  joint swelling GASTROINTESTINAL: [ ]   blood in stool  [ ]   hematemesis GENITOURINARY:  [ ]   dysuria  [ ]   hematuria PSYCHIATRIC:  [ ]  history of major depression INTEGUMENTARY:  [ ]  rashes  [ ]  ulcers CONSTITUTIONAL:  [ ]  fever   [ ]  chills  PHYSICAL EXAMINATION:  General: The patient is a well-nourished male, in no acute distress. Vital signs are BP 144/97 mmHg  Pulse 86  Ht 5\' 9"  (1.753 m)  Wt 218 lb 1.6 oz (98.93 kg)  BMI 32.19 kg/m2  SpO2 100% Pulmonary: There is a good air exchange  Abdomen: Soft and non-tender with no masses noted Musculoskeletal: There are no major deformities.  There is no significant extremity pain. Neurologic: No focal weakness or  paresthesias are detected, Skin: There are no ulcer or rashes noted. Psychiatric: The patient has normal affect. Cardiovascular: Palpable femoral and dorsalis pedis pulses bilaterally.    Impression and Plan: L5-S1 degenerative disc disease. I discussed my role for exposure with the patient. Expose to explained the procedure of left lower quadrant transverse incision with mobilization of the ureter, intraperitoneal contents, iliac vessels. Explain potential injury for all these. Also explained the slight risk of retrograde ejaculation which patient understands. He wished to proceed with surgery as scheduled and we will proceed on 10/18/2014      Jesscia Imm [facility]: [phone]

## 2014-11-02 ENCOUNTER — Other Ambulatory Visit: Payer: Self-pay

## 2014-11-07 ENCOUNTER — Encounter (HOSPITAL_COMMUNITY): Payer: Self-pay

## 2014-11-07 ENCOUNTER — Ambulatory Visit (HOSPITAL_COMMUNITY)
Admission: RE | Admit: 2014-11-07 | Discharge: 2014-11-07 | Disposition: A | Discharge status: Home / Self Care | Payer: 59 | Source: Ambulatory Visit | Attending: Orthopedic Surgery | Admitting: Orthopedic Surgery

## 2014-11-07 ENCOUNTER — Encounter (HOSPITAL_COMMUNITY)
Admission: RE | Admit: 2014-11-07 | Discharge: 2014-11-07 | Disposition: A | Discharge status: Home / Self Care | Payer: 59 | Source: Ambulatory Visit | Attending: Orthopedic Surgery | Admitting: Orthopedic Surgery

## 2014-11-07 DIAGNOSIS — I10 Essential (primary) hypertension: Secondary | ICD-10-CM | POA: Insufficient documentation

## 2014-11-07 DIAGNOSIS — M47897 Other spondylosis, lumbosacral region: Secondary | ICD-10-CM | POA: Diagnosis not present

## 2014-11-07 DIAGNOSIS — Z01818 Encounter for other preprocedural examination: Secondary | ICD-10-CM | POA: Diagnosis not present

## 2014-11-07 DIAGNOSIS — M5137 Other intervertebral disc degeneration, lumbosacral region: Secondary | ICD-10-CM | POA: Diagnosis not present

## 2014-11-07 DIAGNOSIS — I252 Old myocardial infarction: Secondary | ICD-10-CM | POA: Insufficient documentation

## 2014-11-07 HISTORY — DX: Gastro-esophageal reflux disease without esophagitis: K21.9

## 2014-11-07 HISTORY — DX: Unspecified osteoarthritis, unspecified site: M19.90

## 2014-11-07 HISTORY — DX: Other seasonal allergic rhinitis: J30.2

## 2014-11-07 HISTORY — DX: Anxiety disorder, unspecified: F41.9

## 2014-11-07 LAB — BASIC METABOLIC PANEL
ANION GAP: 9 (ref 5–15)
BUN: 11 mg/dL (ref 6–23)
CHLORIDE: 105 meq/L (ref 96–112)
CO2: 24 mmol/L (ref 19–32)
Calcium: 9.6 mg/dL (ref 8.4–10.5)
Creatinine, Ser: 0.76 mg/dL (ref 0.50–1.35)
Glucose, Bld: 103 mg/dL — ABNORMAL HIGH (ref 70–99)
POTASSIUM: 3.8 mmol/L (ref 3.5–5.1)
SODIUM: 138 mmol/L (ref 135–145)

## 2014-11-07 LAB — CBC
HEMATOCRIT: 41.8 % (ref 39.0–52.0)
Hemoglobin: 14.8 g/dL (ref 13.0–17.0)
MCH: 29.4 pg (ref 26.0–34.0)
MCHC: 35.4 g/dL (ref 30.0–36.0)
MCV: 83.1 fL (ref 78.0–100.0)
Platelets: 216 10*3/uL (ref 150–400)
RBC: 5.03 MIL/uL (ref 4.22–5.81)
RDW: 12.9 % (ref 11.5–15.5)
WBC: 6.8 10*3/uL (ref 4.0–10.5)

## 2014-11-07 LAB — SURGICAL PCR SCREEN
MRSA, PCR: NEGATIVE
Staphylococcus aureus: NEGATIVE

## 2014-11-07 LAB — TYPE AND SCREEN
ABO/RH(D): B NEG
Antibody Screen: NEGATIVE

## 2014-11-07 NOTE — Progress Notes (Signed)
Nathan [name] reports have a sleep study in past 2 years, study was negative.  Patient denies chest pain, sob, has never had echo, stress test or cardiac cath.

## 2014-11-07 NOTE — Pre-Procedure Instructions (Signed)
Nathan [name]  11/07/2014   Your procedure is scheduled on: Wednesday, January 6.  Report to [facility] Admitting at 6:30 AM.  Call this number if you have problems the morning of surgery: [phone]               For any other questions, please call [phone], Monday - Thursday 8 AM - 4 PM.   Remember:   Do not eat food or drink liquids after midnight Tuesday, January 5.   Take these medicines the morning of surgery with A SIP OF WATER: amLODipine (NORVASC), metoprolol tartrate (LOPRESSOR), sertraline (ZOLOFT).  May use eye drops and nasal spray.             Take if needed:HYDROcodone-acetaminophen (NORCO/VICODIN).              Stop taking ibuprofen (ADVIL,MOTRIN), Vitamins today.                      Do not wear jewelry, make-up or nail polish.  Do not wear lotions, powders, or perfumes.              Men may shave face and neck.  Do not bring valuables to the hospital.              [facility] is not responsible for any belongings or valuables.               Contacts, dentures or bridgework may not be worn into surgery.  Leave suitcase in the car. After surgery it may be brought to your room.  For patients admitted to the hospital, discharge time is determined by your treatment team.               Patients discharged the day of surgery will not be allowed to drive home.  Name and phone number of your driver: -   Special Instructions:  Bring Brace.--   Please read over the following fact sheets that you were given: Pain Booklet, Coughing and Deep Breathing, Blood Transfusion Information and Surgical Site Infection Prevention

## 2014-11-08 LAB — ABO/RH: ABO/RH(D): B NEG

## 2014-11-08 NOTE — Progress Notes (Signed)
Called [facility] on [address] and they state they will fax an office visit note from 10/26/14 but they do not have any EKGs

## 2014-11-14 MED ORDER — CEFAZOLIN SODIUM-DEXTROSE 2-3 GM-% IV SOLR
2.0000 g | INTRAVENOUS | Status: AC
  Administered 2014-11-15: 2 g via INTRAVENOUS
  Filled 2014-11-14: qty 50

## 2014-11-14 MED ORDER — DEXAMETHASONE SODIUM PHOSPHATE 4 MG/ML IJ SOLN
10.0000 mg | Freq: Once | INTRAMUSCULAR | Status: AC
  Administered 2014-11-15: 10 mg via INTRAVENOUS
  Filled 2014-11-14: qty 3

## 2014-11-15 ENCOUNTER — Inpatient Hospital Stay (HOSPITAL_COMMUNITY): Payer: 59 | Admitting: Anesthesiology

## 2014-11-15 ENCOUNTER — Inpatient Hospital Stay (HOSPITAL_COMMUNITY)
Admission: RE | Admit: 2014-11-15 | Discharge: 2014-11-17 | DRG: 460 | Disposition: A | Discharge status: Home / Self Care | Payer: 59 | Source: Ambulatory Visit | Attending: Orthopedic Surgery | Admitting: Orthopedic Surgery

## 2014-11-15 ENCOUNTER — Inpatient Hospital Stay (HOSPITAL_COMMUNITY): Payer: 59

## 2014-11-15 ENCOUNTER — Encounter (HOSPITAL_COMMUNITY): Payer: Self-pay | Admitting: *Deleted

## 2014-11-15 ENCOUNTER — Encounter (HOSPITAL_COMMUNITY)
Admission: RE | Disposition: A | Discharge status: Home / Self Care | Payer: Self-pay | Source: Ambulatory Visit | Attending: Orthopedic Surgery

## 2014-11-15 DIAGNOSIS — M549 Dorsalgia, unspecified: Secondary | ICD-10-CM

## 2014-11-15 DIAGNOSIS — Z8249 Family history of ischemic heart disease and other diseases of the circulatory system: Secondary | ICD-10-CM | POA: Diagnosis not present

## 2014-11-15 DIAGNOSIS — K219 Gastro-esophageal reflux disease without esophagitis: Secondary | ICD-10-CM | POA: Diagnosis present

## 2014-11-15 DIAGNOSIS — M5137 Other intervertebral disc degeneration, lumbosacral region: Principal | ICD-10-CM | POA: Diagnosis present

## 2014-11-15 DIAGNOSIS — M5136 Other intervertebral disc degeneration, lumbar region: Secondary | ICD-10-CM | POA: Diagnosis present

## 2014-11-15 DIAGNOSIS — Z981 Arthrodesis status: Secondary | ICD-10-CM

## 2014-11-15 DIAGNOSIS — I10 Essential (primary) hypertension: Secondary | ICD-10-CM | POA: Diagnosis present

## 2014-11-15 DIAGNOSIS — K5900 Constipation, unspecified: Secondary | ICD-10-CM | POA: Diagnosis present

## 2014-11-15 HISTORY — PX: ABDOMINAL EXPOSURE: SHX5708

## 2014-11-15 HISTORY — PX: ANTERIOR LUMBAR FUSION: SHX1170

## 2014-11-15 SURGERY — ANTERIOR LUMBAR FUSION 1 LEVEL
Anesthesia: General

## 2014-11-15 MED ORDER — HYDROMORPHONE HCL 1 MG/ML IJ SOLN
0.2500 mg | INTRAMUSCULAR | Status: DC | PRN
  Administered 2014-11-15 (×4): 0.5 mg via INTRAVENOUS

## 2014-11-15 MED ORDER — ONDANSETRON HCL 4 MG/2ML IJ SOLN
INTRAMUSCULAR | Status: AC
  Filled 2014-11-15: qty 2

## 2014-11-15 MED ORDER — LACTATED RINGERS IV SOLN
INTRAVENOUS | Status: DC | PRN
  Administered 2014-11-15 (×2): via INTRAVENOUS

## 2014-11-15 MED ORDER — METOPROLOL TARTRATE 25 MG PO TABS
25.0000 mg | ORAL_TABLET | Freq: Two times a day (BID) | ORAL | Status: DC
  Administered 2014-11-15 – 2014-11-17 (×4): 25 mg via ORAL
  Filled 2014-11-15 (×5): qty 1

## 2014-11-15 MED ORDER — BUPIVACAINE-EPINEPHRINE (PF) 0.25% -1:200000 IJ SOLN
INTRAMUSCULAR | Status: AC
  Filled 2014-11-15: qty 30

## 2014-11-15 MED ORDER — ONDANSETRON HCL 4 MG/2ML IJ SOLN: 4.0000 mg | Freq: Once | INTRAMUSCULAR | Status: DC | PRN

## 2014-11-15 MED ORDER — VECURONIUM BROMIDE 10 MG IV SOLR
INTRAVENOUS | Status: AC
  Filled 2014-11-15: qty 10

## 2014-11-15 MED ORDER — DOCUSATE SODIUM 100 MG PO CAPS
100.0000 mg | ORAL_CAPSULE | Freq: Two times a day (BID) | ORAL | Status: DC
  Administered 2014-11-15 – 2014-11-17 (×5): 100 mg via ORAL
  Filled 2014-11-15 (×4): qty 1

## 2014-11-15 MED ORDER — VECURONIUM BROMIDE 10 MG IV SOLR
INTRAVENOUS | Status: DC | PRN
  Administered 2014-11-15: 4 mg via INTRAVENOUS
  Administered 2014-11-15: 2 mg via INTRAVENOUS
  Administered 2014-11-15: 4 mg via INTRAVENOUS

## 2014-11-15 MED ORDER — AZELASTINE HCL 0.1 % NA SOLN
2.0000 | Freq: Two times a day (BID) | NASAL | Status: DC
  Administered 2014-11-16 – 2014-11-17 (×3): 2 via NASAL
  Filled 2014-11-15: qty 30

## 2014-11-15 MED ORDER — DEXAMETHASONE SODIUM PHOSPHATE 4 MG/ML IJ SOLN
4.0000 mg | Freq: Four times a day (QID) | INTRAMUSCULAR | Status: DC
  Administered 2014-11-15: 4 mg via INTRAVENOUS
  Filled 2014-11-15 (×5): qty 1

## 2014-11-15 MED ORDER — SODIUM CHLORIDE 0.9 % IV SOLN
10.0000 mg | INTRAVENOUS | Status: DC | PRN
  Administered 2014-11-15: 10 ug/min via INTRAVENOUS

## 2014-11-15 MED ORDER — METHOCARBAMOL 1000 MG/10ML IJ SOLN
500.0000 mg | Freq: Four times a day (QID) | INTRAVENOUS | Status: DC | PRN
  Administered 2014-11-15: 500 mg via INTRAVENOUS
  Filled 2014-11-15 (×2): qty 5

## 2014-11-15 MED ORDER — ACETAMINOPHEN 10 MG/ML IV SOLN
INTRAVENOUS | Status: AC
  Filled 2014-11-15: qty 100

## 2014-11-15 MED ORDER — PHENOL 1.4 % MT LIQD: 1.0000 | OROMUCOSAL | Status: DC | PRN

## 2014-11-15 MED ORDER — ACETAMINOPHEN 10 MG/ML IV SOLN
1000.0000 mg | Freq: Four times a day (QID) | INTRAVENOUS | Status: DC
  Administered 2014-11-15 – 2014-11-16 (×3): 1000 mg via INTRAVENOUS
  Filled 2014-11-15 (×4): qty 100

## 2014-11-15 MED ORDER — DEXAMETHASONE SODIUM PHOSPHATE 4 MG/ML IJ SOLN
INTRAMUSCULAR | Status: AC
  Filled 2014-11-15: qty 2

## 2014-11-15 MED ORDER — FENTANYL CITRATE 0.05 MG/ML IJ SOLN
INTRAMUSCULAR | Status: DC | PRN
  Administered 2014-11-15 (×2): 50 ug via INTRAVENOUS
  Administered 2014-11-15: 150 ug via INTRAVENOUS

## 2014-11-15 MED ORDER — SODIUM CHLORIDE 0.9 % IJ SOLN
3.0000 mL | Freq: Two times a day (BID) | INTRAMUSCULAR | Status: DC
  Administered 2014-11-15 – 2014-11-16 (×3): 3 mL via INTRAVENOUS

## 2014-11-15 MED ORDER — FENTANYL CITRATE 0.05 MG/ML IJ SOLN
INTRAMUSCULAR | Status: AC
  Filled 2014-11-15: qty 5

## 2014-11-15 MED ORDER — CHLORHEXIDINE GLUCONATE 4 % EX LIQD: 60.0000 mL | Freq: Once | CUTANEOUS | Status: DC

## 2014-11-15 MED ORDER — SERTRALINE HCL 50 MG PO TABS
50.0000 mg | ORAL_TABLET | Freq: Every day | ORAL | Status: DC
  Administered 2014-11-16 – 2014-11-17 (×2): 50 mg via ORAL
  Filled 2014-11-15 (×2): qty 1

## 2014-11-15 MED ORDER — ENOXAPARIN SODIUM 40 MG/0.4ML ~~LOC~~ SOLN
40.0000 mg | SUBCUTANEOUS | Status: DC
Start: 2014-11-16 — End: 2014-11-17
  Administered 2014-11-15 – 2014-11-16 (×2): 40 mg via SUBCUTANEOUS
  Filled 2014-11-15 (×3): qty 0.4

## 2014-11-15 MED ORDER — SODIUM CHLORIDE 0.9 % IJ SOLN: 3.0000 mL | INTRAMUSCULAR | Status: DC | PRN

## 2014-11-15 MED ORDER — LACTATED RINGERS IV SOLN: INTRAVENOUS | Status: DC

## 2014-11-15 MED ORDER — AMLODIPINE BESYLATE 10 MG PO TABS
10.0000 mg | ORAL_TABLET | Freq: Every day | ORAL | Status: DC
  Administered 2014-11-16 – 2014-11-17 (×2): 10 mg via ORAL
  Filled 2014-11-15 (×2): qty 1

## 2014-11-15 MED ORDER — ACETAMINOPHEN 10 MG/ML IV SOLN
INTRAVENOUS | Status: DC | PRN
  Administered 2014-11-15: 1000 mg via INTRAVENOUS

## 2014-11-15 MED ORDER — ROCURONIUM BROMIDE 50 MG/5ML IV SOLN
INTRAVENOUS | Status: AC
  Filled 2014-11-15: qty 1

## 2014-11-15 MED ORDER — MIDAZOLAM HCL 5 MG/5ML IJ SOLN
INTRAMUSCULAR | Status: DC | PRN
  Administered 2014-11-15: 2 mg via INTRAVENOUS

## 2014-11-15 MED ORDER — LATANOPROST 0.005 % OP SOLN
1.0000 [drp] | Freq: Every day | OPHTHALMIC | Status: DC
  Administered 2014-11-15 – 2014-11-16 (×2): 1 [drp] via OPHTHALMIC
  Filled 2014-11-15: qty 2.5

## 2014-11-15 MED ORDER — 0.9 % SODIUM CHLORIDE (POUR BTL) OPTIME
TOPICAL | Status: DC | PRN
Start: 2014-11-15 — End: 2014-11-15
  Administered 2014-11-15 (×2): 1000 mL

## 2014-11-15 MED ORDER — MIDAZOLAM HCL 2 MG/2ML IJ SOLN
INTRAMUSCULAR | Status: AC
  Filled 2014-11-15: qty 2

## 2014-11-15 MED ORDER — ONDANSETRON HCL 4 MG/2ML IJ SOLN: 4.0000 mg | INTRAMUSCULAR | Status: DC | PRN

## 2014-11-15 MED ORDER — OXYCODONE HCL 5 MG PO TABS
ORAL_TABLET | ORAL | Status: AC
  Filled 2014-11-15: qty 2

## 2014-11-15 MED ORDER — THROMBIN 20000 UNITS EX SOLR
CUTANEOUS | Status: DC | PRN
  Administered 2014-11-15: 10:00:00 via TOPICAL

## 2014-11-15 MED ORDER — ONDANSETRON HCL 4 MG/2ML IJ SOLN
INTRAMUSCULAR | Status: DC | PRN
  Administered 2014-11-15: 4 mg via INTRAVENOUS

## 2014-11-15 MED ORDER — PROPOFOL 10 MG/ML IV BOLUS
INTRAVENOUS | Status: DC | PRN
  Administered 2014-11-15: 200 mg via INTRAVENOUS

## 2014-11-15 MED ORDER — GLYCOPYRROLATE 0.2 MG/ML IJ SOLN
INTRAMUSCULAR | Status: AC
  Filled 2014-11-15: qty 3

## 2014-11-15 MED ORDER — LIDOCAINE HCL (CARDIAC) 20 MG/ML IV SOLN
INTRAVENOUS | Status: DC | PRN
  Administered 2014-11-15: 40 mg via INTRAVENOUS

## 2014-11-15 MED ORDER — MENTHOL 3 MG MT LOZG: 1.0000 | LOZENGE | OROMUCOSAL | Status: DC | PRN

## 2014-11-15 MED ORDER — NEOSTIGMINE METHYLSULFATE 10 MG/10ML IV SOLN
INTRAVENOUS | Status: DC | PRN
  Administered 2014-11-15: 4 mg via INTRAVENOUS

## 2014-11-15 MED ORDER — PROPOFOL 10 MG/ML IV BOLUS
INTRAVENOUS | Status: AC
  Filled 2014-11-15: qty 20

## 2014-11-15 MED ORDER — ROCURONIUM BROMIDE 100 MG/10ML IV SOLN
INTRAVENOUS | Status: DC | PRN
  Administered 2014-11-15: 50 mg via INTRAVENOUS

## 2014-11-15 MED ORDER — NEOSTIGMINE METHYLSULFATE 10 MG/10ML IV SOLN
INTRAVENOUS | Status: AC
  Filled 2014-11-15: qty 1

## 2014-11-15 MED ORDER — MAGNESIUM CITRATE PO SOLN: 1.0000 | Freq: Once | ORAL | Status: AC | PRN

## 2014-11-15 MED ORDER — LIDOCAINE HCL (CARDIAC) 20 MG/ML IV SOLN
INTRAVENOUS | Status: AC
  Filled 2014-11-15: qty 5

## 2014-11-15 MED ORDER — GLYCOPYRROLATE 0.2 MG/ML IJ SOLN
INTRAMUSCULAR | Status: DC | PRN
  Administered 2014-11-15: 0.6 mg via INTRAVENOUS

## 2014-11-15 MED ORDER — STERILE WATER FOR INJECTION IJ SOLN
INTRAMUSCULAR | Status: AC
  Filled 2014-11-15: qty 10

## 2014-11-15 MED ORDER — DEXAMETHASONE 4 MG PO TABS
4.0000 mg | ORAL_TABLET | Freq: Four times a day (QID) | ORAL | Status: DC
  Administered 2014-11-15 – 2014-11-17 (×7): 4 mg via ORAL
  Filled 2014-11-15 (×10): qty 1

## 2014-11-15 MED ORDER — THROMBIN 5000 UNITS EX SOLR
CUTANEOUS | Status: AC
  Filled 2014-11-15: qty 5000

## 2014-11-15 MED ORDER — OXYCODONE HCL 5 MG PO TABS
10.0000 mg | ORAL_TABLET | ORAL | Status: DC | PRN
  Administered 2014-11-15 – 2014-11-17 (×9): 10 mg via ORAL
  Filled 2014-11-15 (×9): qty 2

## 2014-11-15 MED ORDER — CEFAZOLIN SODIUM 1-5 GM-% IV SOLN
1.0000 g | Freq: Three times a day (TID) | INTRAVENOUS | Status: AC
  Administered 2014-11-15 (×2): 1 g via INTRAVENOUS
  Filled 2014-11-15 (×2): qty 50

## 2014-11-15 MED ORDER — METHOCARBAMOL 500 MG PO TABS
500.0000 mg | ORAL_TABLET | Freq: Four times a day (QID) | ORAL | Status: DC | PRN
Start: 2014-11-15 — End: 2014-11-17
  Administered 2014-11-15 – 2014-11-16 (×2): 500 mg via ORAL
  Filled 2014-11-15 (×3): qty 1

## 2014-11-15 MED ORDER — MORPHINE SULFATE 2 MG/ML IJ SOLN
1.0000 mg | INTRAMUSCULAR | Status: DC | PRN
  Administered 2014-11-15: 4 mg via INTRAVENOUS
  Filled 2014-11-15: qty 2

## 2014-11-15 MED ORDER — HYDROMORPHONE HCL 1 MG/ML IJ SOLN
INTRAMUSCULAR | Status: AC
  Filled 2014-11-15: qty 2

## 2014-11-15 SURGICAL SUPPLY — 95 items
APPLIER CLIP 11 MED OPEN (CLIP) ×2
BLADE SURG 10 STRL SS (BLADE) ×2 IMPLANT
BLADE SURG ROTATE 9660 (MISCELLANEOUS) IMPLANT
CLIP APPLIE 11 MED OPEN (CLIP) ×1 IMPLANT
CLIP LIGATING EXTRA MED SLVR (CLIP) IMPLANT
CLIP LIGATING EXTRA SM BLUE (MISCELLANEOUS) IMPLANT
CLSR STERI-STRIP ANTIMIC 1/2X4 (GAUZE/BANDAGES/DRESSINGS) ×2 IMPLANT
CORDS BIPOLAR (ELECTRODE) ×2 IMPLANT
COVER SURGICAL LIGHT HANDLE (MISCELLANEOUS) ×2 IMPLANT
DERMABOND ADVANCED (GAUZE/BANDAGES/DRESSINGS) ×2
DERMABOND ADVANCED .7 DNX12 (GAUZE/BANDAGES/DRESSINGS) ×2 IMPLANT
DRAPE C-ARM 42X72 X-RAY (DRAPES) ×4 IMPLANT
DRAPE INCISE IOBAN 66X45 STRL (DRAPES) IMPLANT
DRAPE POUCH INSTRU U-SHP 10X18 (DRAPES) ×2 IMPLANT
DRAPE SURG 17X23 STRL (DRAPES) ×2 IMPLANT
DRAPE U-SHAPE 47X51 STRL (DRAPES) ×2 IMPLANT
DRSG MEPILEX BORDER 4X8 (GAUZE/BANDAGES/DRESSINGS) ×2 IMPLANT
DURAPREP 26ML APPLICATOR (WOUND CARE) ×2 IMPLANT
ELECT BLADE 4.0 EZ CLEAN MEGAD (MISCELLANEOUS) ×2
ELECT CAUTERY BLADE 6.4 (BLADE) ×2 IMPLANT
ELECT PENCIL ROCKER SW 15FT (MISCELLANEOUS) ×2 IMPLANT
ELECT REM PT RETURN 9FT ADLT (ELECTROSURGICAL) ×2
ELECTRODE BLDE 4.0 EZ CLN MEGD (MISCELLANEOUS) ×1 IMPLANT
ELECTRODE REM PT RTRN 9FT ADLT (ELECTROSURGICAL) ×1 IMPLANT
GAUZE SPONGE 4X4 16PLY XRAY LF (GAUZE/BANDAGES/DRESSINGS) IMPLANT
GLOVE BIO SURGEON STRL SZ7.5 (GLOVE) IMPLANT
GLOVE BIOGEL PI IND STRL 8 (GLOVE) IMPLANT
GLOVE BIOGEL PI IND STRL 8.5 (GLOVE) ×2 IMPLANT
GLOVE BIOGEL PI INDICATOR 8 (GLOVE)
GLOVE BIOGEL PI INDICATOR 8.5 (GLOVE) ×2
GLOVE ECLIPSE 8.5 STRL (GLOVE) ×4 IMPLANT
GLOVE ORTHO TXT STRL SZ7.5 (GLOVE) IMPLANT
GLOVE SS BIOGEL STRL SZ 7.5 (GLOVE) ×1 IMPLANT
GLOVE SUPERSENSE BIOGEL SZ 7.5 (GLOVE) ×1
GOWN STRL REUS W/ TWL LRG LVL3 (GOWN DISPOSABLE) ×3 IMPLANT
GOWN STRL REUS W/TWL 2XL LVL3 (GOWN DISPOSABLE) ×4 IMPLANT
GOWN STRL REUS W/TWL LRG LVL3 (GOWN DISPOSABLE) ×3
GRANULES STERILE 5CC (Bone Implant) ×2 IMPLANT
HEMOSTAT SNOW SURGICEL 2X4 (HEMOSTASIS) IMPLANT
INSERT FOGARTY 61MM (MISCELLANEOUS) IMPLANT
INSERT FOGARTY SM (MISCELLANEOUS) IMPLANT
INTERPLATE 39X14X8 (Plate) ×2 IMPLANT
KIT BASIN OR (CUSTOM PROCEDURE TRAY) ×2 IMPLANT
KIT ROOM TURNOVER OR (KITS) ×4 IMPLANT
LOOP VESSEL MAXI BLUE (MISCELLANEOUS) ×2 IMPLANT
LOOP VESSEL MINI RED (MISCELLANEOUS) ×2 IMPLANT
NEEDLE ASP BONE MRW 11GX15 (NEEDLE) ×2 IMPLANT
NEEDLE SPNL 18GX3.5 QUINCKE PK (NEEDLE) ×2 IMPLANT
NS IRRIG 1000ML POUR BTL (IV SOLUTION) ×4 IMPLANT
PACK LAMINECTOMY ORTHO (CUSTOM PROCEDURE TRAY) ×2 IMPLANT
PACK UNIVERSAL I (CUSTOM PROCEDURE TRAY) ×2 IMPLANT
PAD ARMBOARD 7.5X6 YLW CONV (MISCELLANEOUS) ×8 IMPLANT
PEEK SPACER INTERPLAT 35X14X8 (Peek) ×2 IMPLANT
PLATE SPINAL INTERPLAT 39X14X8 (Plate) ×1 IMPLANT
PUTTY BONE DBX 5CC MIX (Putty) ×2 IMPLANT
SCREW BONE RESCUE (Screw) ×4 IMPLANT
SCREW BONE STANDARD (Screw) ×2 IMPLANT
SCREW BONE STD (Screw) ×1 IMPLANT
SCREW SPINAL STD (Orthopedic Implant) ×2 IMPLANT
SPONGE INTESTINAL PEANUT (DISPOSABLE) ×8 IMPLANT
SPONGE LAP 18X18 X RAY DECT (DISPOSABLE) IMPLANT
SPONGE LAP 4X18 X RAY DECT (DISPOSABLE) IMPLANT
SPONGE SURGIFOAM ABS GEL 100 (HEMOSTASIS) IMPLANT
STAPLER VISISTAT 35W (STAPLE) IMPLANT
STRIP CLOSURE SKIN 1/2X4 (GAUZE/BANDAGES/DRESSINGS) IMPLANT
SURGIFLO TRUKIT (HEMOSTASIS) ×2 IMPLANT
SUT BONE WAX W31G (SUTURE) ×2 IMPLANT
SUT MNCRL AB 4-0 PS2 18 (SUTURE) IMPLANT
SUT MON AB 3-0 SH 27 (SUTURE) ×1
SUT MON AB 3-0 SH27 (SUTURE) ×1 IMPLANT
SUT PDS AB 1 CTX 36 (SUTURE) ×2 IMPLANT
SUT PROLENE 4 0 RB 1 (SUTURE)
SUT PROLENE 4-0 RB1 .5 CRCL 36 (SUTURE) IMPLANT
SUT PROLENE 5 0 C 1 24 (SUTURE) IMPLANT
SUT PROLENE 5 0 CC1 (SUTURE) ×2 IMPLANT
SUT PROLENE 6 0 C 1 30 (SUTURE) ×2 IMPLANT
SUT PROLENE 6 0 CC (SUTURE) IMPLANT
SUT SILK 0 TIES 10X30 (SUTURE) IMPLANT
SUT SILK 2 0 TIES 10X30 (SUTURE) ×4 IMPLANT
SUT SILK 2 0SH CR/8 30 (SUTURE) IMPLANT
SUT SILK 3 0 TIES 10X30 (SUTURE) ×2 IMPLANT
SUT SILK 3 0SH CR/8 30 (SUTURE) IMPLANT
SUT VIC AB 0 CT1 27 (SUTURE)
SUT VIC AB 0 CT1 27XBRD ANBCTR (SUTURE) IMPLANT
SUT VIC AB 1 CT1 27 (SUTURE) ×2
SUT VIC AB 1 CT1 27XBRD ANBCTR (SUTURE) ×2 IMPLANT
SUT VIC AB 2-0 CT1 18 (SUTURE) ×2 IMPLANT
SUT VIC AB 2-0 CT1 36 (SUTURE) IMPLANT
SUT VIC AB 3-0 SH 27 (SUTURE)
SUT VIC AB 3-0 SH 27X BRD (SUTURE) IMPLANT
SYR BULB IRRIGATION 50ML (SYRINGE) ×2 IMPLANT
TOWEL OR 17X24 6PK STRL BLUE (TOWEL DISPOSABLE) ×2 IMPLANT
TOWEL OR 17X26 10 PK STRL BLUE (TOWEL DISPOSABLE) ×2 IMPLANT
TRAY FOLEY CATH 16FR SILVER (SET/KITS/TRAYS/PACK) ×2 IMPLANT
WATER STERILE IRR 1000ML POUR (IV SOLUTION) IMPLANT

## 2014-11-15 NOTE — Evaluation (Addendum)
Occupational Therapy Evaluation Patient Details Name: Nathan [name] MRN: [id number] DOB: [birth year] Today's Date: 11/15/2014    History of Present Illness 49 y.o. s/p L5-S1 ALIF/ ABDOMINAL EXPOSURE   Clinical Impression   Pt s/p above. Pt independent with ADLs, PTA. Feel pt will benefit from acute OT to increase independence with BADLs, prior to d/c.    Follow Up Recommendations  No OT follow up;Supervision - Intermittent    Equipment Recommendations  3 in 1 bedside comode    Recommendations for Other Services       Precautions / Restrictions Precautions Precautions: Back;Fall Precaution Booklet Issued: Yes (comment) Precaution Comments: educated on precautions Required Braces or Orthoses: Spinal Brace Spinal Brace: Lumbar corset;Applied in sitting position Restrictions Weight Bearing Restrictions: No      Mobility Bed Mobility Overal bed mobility: Needs Assistance Bed Mobility: Rolling;Sidelying to Sit;Sit to Sidelying Rolling: Modified independent (Device/Increase time)/Supervision Sidelying to sit: Min guard      Sit to sidelying: Min guard General bed mobility comments: cues for technique.  Transfers Overall transfer level: Needs assistance   Transfers: Sit to/from Stand Sit to Stand: Min guard         General transfer comment: cues for technique    Balance                                            ADL Overall ADL's : Needs assistance/impaired                 Upper Body Dressing : Minimal assistance;Sitting   Lower Body Dressing: Min guard;Sitting/lateral leans;With adaptive equipment   Toilet Transfer: Min guard;Ambulation (bed)   Toileting- Clothing Manipulation and Hygiene: Minimal assistance;Sit to/from stand       Functional mobility during ADLs: Min guard-only took a couple steps General ADL Comments: Educated on AE/cost/where to purchase. Educated on safety such as safe shoewear, rugs, sitting for LB  bathing. Talked about 3 in 1 with pt/mother. Educated on incorporating precautions into functional activities. Educated on back brace. Educated on use of cup for oral care and placement of grooming items to avoid breaking precautions. Discussed positioning of pillows. Pt practiced with reacher/sockaid as he was unable to fully cross legs over knees. Educated on what pt can use for toilet aide.     Vision                     Perception     Praxis      Pertinent Vitals/Pain Pain Assessment: 0-10 Pain Score: 8  Pain Location: back and stomach Pain Intervention(s): Repositioned;Monitored during session     Hand Dominance Right   Extremity/Trunk Assessment Upper Extremity Assessment Upper Extremity Assessment: Overall WFL for tasks assessed   Lower Extremity Assessment Lower Extremity Assessment: Defer to PT evaluation       Communication Communication Communication: No difficulties   Cognition Arousal/Alertness: Awake/alert Behavior During Therapy: WFL for tasks assessed/performed Overall Cognitive Status: Within Functional Limits for tasks assessed                     General Comments       Exercises       Shoulder Instructions      Home Living Family/patient expects to be discharged to:: Private residence Living Arrangements: Spouse/significant other Available Help at Discharge: Family;Available 24 hours/day Type of Home: [place]  Access: Stairs to enter [employer] of Steps: 5 Entrance Stairs-Rails: Right;Left;Can reach both Home Layout: Two level;Able to live on main level with bedroom/bathroom (however wants to be able to go to "man cave" ) Alternate Level Stairs-Number of Steps: 14 Alternate Level Stairs-Rails: Left Bathroom Shower/Tub: Occupational psychologist: Standard     Home Equipment: Proofreader (mother has sockaid)        Prior Functioning/Environment Level of  Independence: Independent             OT Diagnosis: Acute pain   OT Problem List: Decreased range of motion;Decreased activity tolerance;Pain;Decreased knowledge of precautions;Decreased knowledge of use of DME or AE   OT Treatment/Interventions: Self-care/ADL training;DME and/or AE instruction;Therapeutic activities;Patient/family education;Balance training    OT Goals(Current goals can be found in the care plan section) Acute Rehab OT Goals Patient Stated Goal: not stated OT Goal Formulation: With patient Time For Goal Achievement: 11/22/14 Potential to Achieve Goals: Good ADL Goals Pt Will Perform Lower Body Dressing: with modified independence;with adaptive equipment;sit to/from stand Pt Will Transfer to Toilet: with modified independence;ambulating Pt Will Perform Toileting - Clothing Manipulation and hygiene: with modified independence;sit to/from stand;with adaptive equipment Additional ADL Goal #1: Pt will independently verbalize and demonstrate 3/3 back precautions. Additional ADL Goal #2: Pt will independently donn/doff back brace.  OT Frequency: Min 2X/week   Barriers to D/C:            Co-evaluation              End of Session Equipment Utilized During Treatment: Gait belt;Back brace; O2 placed back on towards end of session Nurse Communication: Other (comment);Mobility status  Activity Tolerance: Other (comment) (dizziness) Patient left: in bed;with call bell/phone within reach;with family/visitor present   Time: 1431-1456 OT Time Calculation (min): 25 min Charges:  OT General Charges $OT Visit: 1 Procedure OT Evaluation $Initial OT Evaluation Tier I: 1 Procedure OT Treatments $Self Care/Home Management : 8-22 mins G-CodesBenito Mccreedy OTR/L [phone] 11/15/2014, 4:13 PM

## 2014-11-15 NOTE — Evaluation (Signed)
Physical Therapy Evaluation and Discharge  Patient Details Name: Nathan [name] MRN: [id number] DOB: 02-28-1966 Today's Date: 11/15/2014   History of Present Illness  49 y.o. s/p L5-S1 ALIF/ ABDOMINAL EXPOSURE  Clinical Impression  Patient evaluated by Physical Therapy with no further acute PT needs identified. All education has been completed and the patient has no further questions.  See below for any follow-up Physial Therapy or equipment needs. PT is signing off. Pt encouraged to continue ambulating with nursing. Thank you for this referral.     Follow Up Recommendations No PT follow up;Supervision - Intermittent    Equipment Recommendations  None recommended by PT    Recommendations for Other Services       Precautions / Restrictions Precautions Precautions: Back Precaution Booklet Issued: Yes (comment) Precaution Comments: reviewed handout; pt able to recall "BAT" and back precautions  Required Braces or Orthoses: Spinal Brace Spinal Brace: Lumbar corset;Applied in sitting position Restrictions Weight Bearing Restrictions: No      Mobility  Bed Mobility Overal bed mobility: Modified Independent Bed Mobility: Rolling;Sidelying to Sit;Sit to Sidelying Rolling: Modified independent (Device/Increase time) Sidelying to sit: Min guard     Sit to sidelying: Min guard General bed mobility comments: pt able to perform log roll technique without cueing  Transfers Overall transfer level: Needs assistance Equipment used: None Transfers: Sit to/from Stand Sit to Stand: Modified independent (Device/Increase time)         General transfer comment: no sway or LOB noted  Ambulation/Gait Ambulation/Gait assistance: Modified independent (Device/Increase time) Ambulation Distance (Feet): 150 Feet Assistive device: None Gait Pattern/deviations: Step-through pattern;Decreased stride length Gait velocity: guarded Gait velocity interpretation: Below normal speed for  age/gender General Gait Details: no LOB noted with ambulation; encouraged increased ambulation with family and nursing   Stairs Stairs:  (verbally reviewed technique)          Wheelchair Mobility    Modified Rankin (Stroke Patients Only)       Balance Overall balance assessment: No apparent balance deficits (not formally assessed)                                           Pertinent Vitals/Pain Pain Assessment: 0-10 Pain Score: 5  Pain Location: back Pain Descriptors / Indicators: Sore Pain Intervention(s): Monitored during session;Premedicated before session;Repositioned    Home Living Family/patient expects to be discharged to:: Private residence Living Arrangements: Spouse/significant other Available Help at Discharge: Family;Available 24 hours/day Type of Home: House Home Access: Stairs to enter Entrance Stairs-Rails: Right;Left;Can reach both Entrance Stairs-Number of Steps: 5 Home Layout: Two level;Able to live [name] main level with bedroom/bathroom Home Equipment: Adaptive equipment      Prior Function Level of Independence: Independent               Hand Dominance   Dominant Hand: Right    Extremity/Trunk Assessment   Upper Extremity Assessment: Defer to OT evaluation           Lower Extremity Assessment: Overall WFL for tasks assessed      Cervical / Trunk Assessment: Normal  Communication   Communication: No difficulties  Cognition Arousal/Alertness: Awake/alert Behavior During Therapy: WFL for tasks assessed/performed Overall Cognitive Status: Within Functional Limits for tasks assessed                      General Comments  Exercises        Assessment/Plan    PT Assessment Patent does not need any further PT services  PT Diagnosis Generalized weakness;Acute pain   PT Problem List    PT Treatment Interventions     PT Goals (Current goals can be found in the Care Plan section) Acute Rehab PT  Goals Patient Stated Goal: to go home Friday PT Goal Formulation: All assessment and education complete, DC therapy    Frequency     Barriers to discharge        Co-evaluation               End of Session Equipment Utilized During Treatment: Back brace Activity Tolerance: Patient tolerated treatment well Patient left: in bed;with call bell/phone within reach;with nursing/sitter in room;with family/visitor present Nurse Communication: Mobility status         Time: 1353-1403 PT Time Calculation (min) (ACUTE ONLY): 10 min   Charges:   PT Evaluation $Initial PT Evaluation Tier I: 1 Procedure PT Treatments $Gait Training: 8-22 mins   PT G CodesGustavus [name], [place]  [phone] 11/15/2014, 4:52 PM

## 2014-11-15 NOTE — Brief Op Note (Signed)
11/15/2014  11:02 AM  PATIENT:  Nathan [name]  49 y.o. male  PRE-OPERATIVE DIAGNOSIS:  DEGERATIVE DISC DISEASE  L5-S1 WITH STENOSIS  POST-OPERATIVE DIAGNOSIS:  DEGERATIVE DISC DISEASE  L5-S1 WITH STENOSIS  PROCEDURE:  Procedure(s): ALIF L5-S1 (N/A) ABDOMINAL EXPOSURE (N/A)  SURGEON:  Surgeon(s) and Role: Panel 1:    * Melina Schools, MD - Primary  Panel 2:    * Rosetta Posner, MD - Primary  PHYSICIAN ASSISTANT:   ASSISTANTS: Curt Jews, MD   ANESTHESIA:   general  EBL:  Total I/O In: 1700 [I.V.:1700] Out: 550 [Urine:450; Blood:100]  BLOOD ADMINISTERED:none  DRAINS: none   LOCAL MEDICATIONS USED:  NONE  SPECIMEN:  No Specimen  DISPOSITION OF SPECIMEN:  N/A  COUNTS:  YES  TOURNIQUET:  * No tourniquets in log *  DICTATION: .Other Dictation: Dictation Number [id number]  PLAN OF CARE: Admit to inpatient   PATIENT DISPOSITION:  PACU - hemodynamically stable.

## 2014-11-15 NOTE — Anesthesia Postprocedure Evaluation (Signed)
  Anesthesia Post-op Note  Patient: Nathan [name]  Procedure(s) Performed: Procedure(s): ALIF L5-S1 (N/A) ABDOMINAL EXPOSURE (N/A)  Patient Location: PACU  Anesthesia Type:General  Level of Consciousness: awake, alert , oriented and patient cooperative  Airway and Oxygen Therapy: Patient Spontanous Breathing  Post-op Pain: moderate  Post-op Assessment: Post-op Vital signs reviewed, Patient's Cardiovascular Status Stable, Respiratory Function Stable, Patent Airway, No signs of Nausea or vomiting and Pain level controlled  Post-op Vital Signs: stable  Last Vitals:  Filed Vitals:   11/15/14 1115  BP: 108/66  Pulse: 83  Temp:   Resp: 20    Complications: No apparent anesthesia complications

## 2014-11-15 NOTE — Op Note (Signed)
    OPERATIVE REPORT  DATE OF SURGERY: 11/15/2014  PATIENT: Nathan [name], 49 y.o. male MRN: [id number]  DOB: [birth year]  PRE-OPERATIVE DIAGNOSIS: L5-S1 degenerative disc disease  POST-OPERATIVE DIAGNOSIS:  Same  PROCEDURE: Anterior exposure for L5-S1 disc surgery  SURGEON:  Curt Jews, M.D.   co-surgeon for the exposure: Dr. Rolena Infante  ANESTHESIA:  Gen.  EBL: 100 ml  Total I/O In: 1800 [I.V.:1800] Out: 550 [Urine:450; Blood:100]  BLOOD ADMINISTERED: None  DRAINS: None    COUNTS CORRECT:  YES  PLAN OF CARE: PACU   PATIENT DISPOSITION:  PACU - hemodynamically stable  PROCEDURE DETAILS: Patient was taken to the operative placed was removed the abdomen was prepped and draped in usual sterile fashion. Projection reveal the level of the L5-S1 disc. Incision was made from the level of the midline laterally to the left. This was carried down through any fat in line with the skin incision. Anterior rectus sheath was opened in line with skin incision as well. The rectus muscle was mobilized circumferentially and the anterior sheath was dissected off the rectus muscle superiorly and inferiorly. The respirationswas entered bluntly lateral to the rectus muscle. Blunt dissection was used to mobilize the retroperitoneum from the posterior rectus sheath which was opened laterally for better exposure. Blunt dissection was used to mobilize the ureter and intraperitoneal contents to the right. The psoas muscle and iliac vessels divided and dissection was continued anterior to this. The dissection extended down to the level of the L5-S1 disc. The middle sacral vessels were identified and were clipped with ligaclips and divided. Blunt dissection was continued over the L5-S1 disc to the right and lateral edges of the disc. Once this had been fully the [facility] retractor was brought onto the field. The reverse lip by 150 blades were positioned to the right and left of the disc and the malleable  retractors were used for superior and inferior exposure. C-arm was brought back onto the field to confirm that this was the L5-S1 disc. The remainder of the surgery to include the disc repair and closure we dictated as a separate note by Dr. Stevie Kern, M.D. 11/15/2014 11:50 AM

## 2014-11-15 NOTE — Anesthesia Preprocedure Evaluation (Addendum)
Anesthesia Evaluation  Patient identified by MRN, date of birth, ID band Patient awake    Reviewed: Allergy & Precautions, NPO status , Patient's Chart, lab work & pertinent test results  Airway Mallampati: III       Dental  (+) Teeth Intact   Pulmonary          Cardiovascular hypertension, Pt. on home beta blockers     Neuro/Psych    GI/Hepatic GERD-  Medicated and Controlled,  Endo/Other    Renal/GU      Musculoskeletal  (+) Arthritis -,   Abdominal   Peds  Hematology   Anesthesia Other Findings   Reproductive/Obstetrics                            Anesthesia Physical Anesthesia Plan  ASA: II  Anesthesia Plan: General   Post-op Pain Management:    Induction: Intravenous  Airway Management Planned: Oral ETT  Additional Equipment:   Intra-op Plan:   Post-operative Plan: Extubation in OR  Informed Consent: I have reviewed the patients History and Physical, chart, labs and discussed the procedure including the risks, benefits and alternatives for the proposed anesthesia with the patient or authorized representative who has indicated his/her understanding and acceptance.     Plan Discussed with: CRNA, Anesthesiologist and Surgeon  Anesthesia Plan Comments:         Anesthesia Quick Evaluation

## 2014-11-15 NOTE — OR Nursing (Signed)
Dr Jeralyn Ruths, Radiologist, called and stated that the instrumentation at L-5-S-1 was all that visible on the xray film  @ 1054.

## 2014-11-15 NOTE — Anesthesia Procedure Notes (Signed)
Procedure Name: Intubation Date/Time: 11/15/2014 8:35 AM Performed by: Kyung Rudd Pre-anesthesia Checklist: Patient identified, Emergency Drugs available, Suction available, Patient being monitored and Timeout performed Patient Re-evaluated:Patient Re-evaluated prior to inductionOxygen Delivery Method: Circle system utilized Preoxygenation: Pre-oxygenation with 100% oxygen Intubation Type: IV induction Ventilation: Mask ventilation without difficulty and Oral airway inserted - appropriate to patient size Laryngoscope Size: Mac and 3 Grade View: Grade III Tube type: Oral Tube size: 7.5 mm Number of attempts: 1 Airway Equipment and Method: Bougie stylet Placement Confirmation: ETT inserted through vocal cords under direct vision,  positive ETCO2 and breath sounds checked- equal and bilateral Secured at: 22 cm Tube secured with: Tape Dental Injury: Teeth and Oropharynx as per pre-operative assessment  Comments: Suggest a Mac 4 blade for next intubation.

## 2014-11-15 NOTE — H&P (Signed)
The patient is a 49 year old male who presents with back pain. The patient reports low back symptoms including low back pain and tingling (in left foot on/off) which began year(s) ago without any known injury. Symptoms are reported to be located in the left low back and Symptoms include pain and paresthesias (left leg to the foot "off and on"), while symptoms do not include incontinence of stool or incontinence of urine. The patient describes the pain as dull and aching. The patient describes the severity of their symptoms as 7-8 / 10 on an analog pain scale (increases with activity). The patient feels as if the symptoms are unchanging. Symptoms are exacerbated by standing, sitting, recumbency, lifting and bending.  Current treatment includes nonsteroidal anti-inflammatory drugs (Advil), opioid analgesics (Oxycodone from Dr Nelva Bush) and epidural injections (helped for a short period). Prior to being seen today the patient was previously evaluated in this clinic (Dr Tonita Cong, Dr Nelva Bush). Past evaluation has included x-ray of the lumbar spine and MRI of the lumbar spine. Past treatment has included nonsteroidal anti-inflammatory drugs (Ibuprofen) and epidural injections (somewhat helpful, but patient is still in pain). 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  Subjective Transcription He returns today for follow up.  Allergies  Lisinopril *ANTIHYPERTENSIVES*  Family History  Rheumatoid Arthritis grandmother fathers side Hypertension mother, father and grandfather mothers side Heart Disease father Diabetes Mellitus mother and grandmother mothers side Cerebrovascular Accident grandfather mothers side First Degree Relatives reported  Social History  Tobacco use Never smoker. never smoker Drug/Alcohol Rehab (Currently) no Alcohol use current drinker; drinks beer; less than 5 per week Drug/Alcohol Rehab (Previously) no Children 1 Current work status working full  time Number of flights of stairs before winded greater than 5 Living situation live with spouse Exercise Exercises daily; does running / walking and gym / weights Illicit drug use no Pain Contract no Marital status married Never smoker  Medication History  Percocet (5-325MG  Tablet, 1 Tablet Oral two times daily, as needed, Taken starting 09/13/2014) Active. Famotidine (20MG  Tablet, Oral) Active. Sertraline HCl (50MG  Tablet, Oral) Active. Fluticasone Propionate (50MCG/ACT Suspension, Nasal) Active. AmLODIPine Besylate (2.5MG  Tablet, Oral) Active. Metoprolol Tartrate (50MG  Tablet, Oral) Active. Azelastine HCl (137MCG/SPRAY Solution, Nasal) Active. Medications Reconciled  Objective Transcription  Clinically, he continues to have significant back, buttock and bilateral thigh pain. He has pain with forward flexion, extension and rotation. He has loss in his quality of life. No shortness of breath or chest pain. The abdomen is soft and nontender. No loss of bowel or bladder control. He walks with a limp because of his back pain. He has no focal motor deficits. He does have diffuse sensory changes with dysesthesias in the lower extremities. The compartments are soft and nontender. He has 2+ dorsalis pedis and posterior tibialis pulses.Lungs CTA  Heart - RRR.     Assessment & Plan  Goal Of Surgery:Discussed that goal of surgery is to reduce pain and improve function and quality of life. Patient is aware that despite all appropriate treatment that there pain and function could be the same, worse, or different. Anterior lumbar fusion: Risks of surgery include, but are not limited to: Death, stroke, paralysis, nerve root damage/injury, bleeding, blood clots, loss of bowel/bladder control, sexual dysfunction, retrograde ejaculation, hardware failure, or mal-position, spinal fluid leak, adjacent segment disease, non-union, need for further surgery, ongoing or worse pain, injury  to bladder, bowel and abdominal contents, infection. Need for supplemental posterior surgery including decompression and need  for screw fixation.  Plans Transcription  At this point in time, we have gone over his surgical options. Initially, he was interested in a disk replacement, but unfortunately, that was denied by his insurance company. Because of the severe pain and the failure of conservative management, which is including injection therapy, physiotherapy, activity modification and medications, he would like to proceed with surgery. I think the best option is an anterior lumbar interbody fusion. This allows the best access to the disk. We can just remove the entire painful disk and implant the cage, which will allow for indirect neural decompression and stabilization. We have gone over the risks which include infection, bleeding, nerve damage, death, stroke, paralysis, failure to heal, need for further surgery, ongoing or worse pain, retrograde ejaculation, damage to the bladder. All of his questions were addressed.

## 2014-11-15 NOTE — Plan of Care (Signed)
Problem: Consults Goal: Diagnosis - Spinal Surgery Outcome: Completed/Met Date Met:  11/15/14 L5-S1 ALIF/ ABD. EXPOSURE

## 2014-11-15 NOTE — Progress Notes (Signed)
Bilateral lower extremity venous duplex completed:  No evidence of DVT, superficial thrombosis, or Baker's cyst.   

## 2014-11-15 NOTE — Transfer of Care (Signed)
Immediate Anesthesia Transfer of Care Note  Patient: Nathan [name]  Procedure(s) Performed: Procedure(s): ALIF L5-S1 (N/A) ABDOMINAL EXPOSURE (N/A)  Patient Location: PACU  Anesthesia Type:General  Level of Consciousness: awake, alert  and oriented  Airway & Oxygen Therapy: Patient Spontanous Breathing and Patient connected to face mask oxygen  Post-op Assessment: Report given to PACU RN, Post -op Vital signs reviewed and stable and Patient moving all extremities X 4  Post vital signs: Reviewed and stable  Complications: No apparent anesthesia complications

## 2014-11-15 NOTE — Progress Notes (Signed)
Utilization review completed.  

## 2014-11-16 ENCOUNTER — Encounter (HOSPITAL_COMMUNITY): Payer: Self-pay | Admitting: Orthopedic Surgery

## 2014-11-16 ENCOUNTER — Inpatient Hospital Stay (HOSPITAL_COMMUNITY): Payer: 59

## 2014-11-16 MED ORDER — ONDANSETRON HCL 4 MG PO TABS: 8.0000 mg | ORAL_TABLET | Freq: Three times a day (TID) | ORAL | Status: DC | PRN

## 2014-11-16 MED ORDER — MAGNESIUM CITRATE PO SOLN
0.5000 | Freq: Once | ORAL | Status: AC
  Administered 2014-11-16: 0.5 via ORAL
  Filled 2014-11-16: qty 296

## 2014-11-16 NOTE — Op Note (Signed)
NAMEJADER, DESAI              ACCOUNT NO.:  [id number]  MEDICAL RECORD NO.:  [id number]  LOCATION:  3C08C                        FACILITY:  [facility]  PHYSICIAN:  Dahlia Bailiff, MD    DATE OF BIRTH:  11/26/1965  DATE OF PROCEDURE:  11/15/2014 DATE OF DISCHARGE:                              OPERATIVE REPORT   PREOPERATIVE DIAGNOSIS:  Chronic degenerative disk disease at L5-S1 with diskogenic back pain at L5-S1.  POSTOPERATIVE DIAGNOSIS:  Chronic degenerative disk disease at L5-S1 with diskogenic back pain at L5-S1.  PROCEDURE:  Anterior lumbar interbody fusion L5-S1.  COMPLICATIONS:  None.  ESTIMATED BLOOD LOSS:  Minimal blood loss.  APPROACH SURGEON:  Dr. Sherren Mocha Early.  SURGEON:  Dahlia Bailiff, MD  HISTORY:  This is a very pleasant gentleman, was complaining of severe debilitating back, buttock, and bilateral thigh pain.  Attempts at conservative management had failed to alleviate his symptoms, and he continued to suffer.  As a result, we elected to proceed with surgery. All appropriate risks, benefits, and alternatives were discussed with the patient, and  consent was obtained.  OPERATIVE NOTE:  The patient was brought to the operating room, placed supine on the operating table.  After successful induction of general anesthesia and endotracheal intubation, TEDs, SCDs, and a Foley were inserted.  The patient was placed supine on the operating table.  The abdomen was prepped and draped in standard fashion.  Time-out was taken confirming patient, procedure, and all other pertinent important data. Dr. Sherren Mocha Early, then performed a standard retroperitoneal approach to the anterior spine.  I was assisting him for this.  Once he had placed the retractors, we could visualize the L5-S1 disk space.  He scrubbed out and I continued with the procedure.  Please refer to his dictation for specifics on the approach.  Once we had the approach completed and the retractors properly  positioned, I marked the disk space, took an x- ray to confirm the L5-S1 level.  Once this was done, I performed an annulotomy with a #10 blade scalpel and then began removing the bulk of the disk material.  I then used curettes and Kerrison rongeurs to continue my disk removal.  I then gently distracted the interbody space and was able to use a 2 mm Kerrison rongeur to resect the posterior annulus and posterior aspect of the L5 and S1 vertebral body.  Once I completely decompressed this area under live fluoro, I tested the distraction to ensure I had parallel distraction.  Once this was completed, I then trialed with intervertebral spacers and elected to use the RSV size 14 large 8-degree lordotic PEEK cage packed with DBX mix and chronOS.  I did aspirate the S1 vertebral body to obtain the blood to mix with chronOS before packing in the cage.  Once this was done, I then applied the anterior lumbar plate and secured it with appropriate size locking screws 2 into the L5 and 1 into S1.  All screws had excellent purchase.  I irrigated the wound copiously with normal saline, placed lock-out plate to prevent backout of the screws and it was torqued appropriately.  I then irrigated the wound copiously.  Placed FloSeal to  help maintain hemostasis and then removed the retractors to confirm I had hemostasis.  Once this was done, final x-rays were taken which were satisfactory in both planes.  The OR counts were correct and the final x-ray, AP x-ray, demonstrated no retained surgical instrumentation other than the hardware that was placed for the fusion.  I closed the rectus sheath with a running #1 PDS suture and then superficially with 2-0 Vicryl sutures and a 3-0 Monocryl for the skin. Steri-Strips and dry dressing were applied.  The patient was ultimately extubated and transferred to PACU without incident.  At the end of the case, all needle and sponge counts were correct.     Dahlia Bailiff, MD     DDB/MEDQ  D:  11/15/2014  T:  11/16/2014  Job:  [id number]  cc:   Rosetta Posner, M.D.

## 2014-11-16 NOTE — Progress Notes (Signed)
Occupational Therapy Treatment and Discharge Patient Details Name: Nathan [name] MRN: [id number] DOB: 01/05/1966 Today's Date: 11/16/2014    History of present illness 49 y.o. s/p L5-S1 ALIF/ ABDOMINAL EXPOSURE   OT comments  All education completed.  Pt is knowledgeable in back precautions, safety and is performing ADL and ADL transfers at a modified independent level.    Follow Up Recommendations  No OT follow up;Supervision - Intermittent    Equipment Recommendations  None recommended by OT    Recommendations for Other Services      Precautions / Restrictions Precautions Precautions: Back Precaution Comments: pt generalized back precautions in mobility and ADL Required Braces or Orthoses: Spinal Brace Spinal Brace: Lumbar corset;Applied in sitting position Restrictions Weight Bearing Restrictions: No       Mobility Bed Mobility Overal bed mobility: Modified Independent                Transfers   Equipment used: None   Sit to Stand: Modified independent (Device/Increase time)              Balance                                   ADL Overall ADL's : Needs assistance/impaired                 Upper Body Dressing : Modified independent (donned back brace)       Toilet Transfer: Modified Independent;Ambulation;Regular Toilet   Toileting- Clothing Manipulation and Hygiene: Modified independent;Sit to/from stand;Cueing for back precautions   Tub/ Shower Transfer: Modified independent;Ambulation;Walk-in shower   Functional mobility during ADLs: Modified independent General ADL Comments: Educated in use of reacher and to dress L LE first as it is slightly weaker. Instructed pt in standing to shower with use of long handled bath sponge for feet stabilizing himself holding the wall. Instructed in seating at home that would adhere to back precautions.  Advised against rocker recliner unless blocked and not using reclining mechanism.       Vision                     Perception     Praxis      Cognition   Behavior During Therapy: WFL for tasks assessed/performed Overall Cognitive Status: Within Functional Limits for tasks assessed                       Extremity/Trunk Assessment               Exercises     Shoulder Instructions       General Comments      Pertinent Vitals/ Pain       Pain Assessment: 0-10 Pain Score: 6  Pain Location: back Pain Descriptors / Indicators: Aching Pain Intervention(s): Repositioned;Monitored during session;RN gave pain meds during session;Patient requesting pain meds-RN notified  Home Living                                          Prior Functioning/Environment              Frequency       Progress Toward Goals  OT Goals(current goals can now be found in the care plan section)  Progress towards OT goals: Goals met/education completed, patient discharged  from OT  Acute Rehab OT Goals Patient Stated Goal: to go home Friday  Plan Discharge plan remains appropriate    Co-evaluation                 End of Session Equipment Utilized During Treatment: Back brace   Activity Tolerance Patient tolerated treatment well   Patient Left in chair;with call bell/phone within reach;with nursing/sitter in room (eating breakfast)   Nurse Communication Patient requests pain meds        Time: 3943-2003 OT Time Calculation (min): 26 min  Charges: OT General Charges $OT Visit: 1 Procedure OT Treatments $Self Care/Home Management : 23-37 mins  Malka So 11/16/2014, 9:26 AM  [phone]

## 2014-11-16 NOTE — Progress Notes (Signed)
Patient ID: Nathan [name], male   DOB: [birth year], 49 y.o.   MRN: [id number] Comfortable this morning. Was up and ambulating yesterday. Reports moderate abdominal and back soreness. No nausea or vomiting  Abdomen soft. Dressing intact. 2-3+ pedal pulses.  Stable postop day 1. Will not follow actively. Please call if we can provide vascular assistance.

## 2014-11-16 NOTE — Progress Notes (Signed)
    Subjective: Procedure(s) (LRB): ALIF L5-S1 (N/A) ABDOMINAL EXPOSURE (N/A) 1 Day Post-Op  Patient reports pain as 3 on 0-10 scale.  Reports none leg pain reports incisional back pain   Positive void Negative bowel movement Positive flatus Negative chest pain or shortness of breath  Objective: Vital signs in last 24 hours: Temp:  [97.9 F (36.6 C)-98.7 F (37.1 C)] 98.4 F (36.9 C) (01/07 0410) Pulse Rate:  [75-115] 88 (01/07 0410) Resp:  [14-21] 18 (01/07 0410) BP: (93-130)/(51-79) 126/79 mmHg (01/07 0410) SpO2:  [92 %-96 %] 95 % (01/07 0410) FiO2 (%):  [2 %] 2 % (01/06 1625) Weight:  [99.338 kg (219 lb)] 99.338 kg (219 lb) (01/06 0720)  Intake/Output from previous day: 01/06 0701 - 01/07 0700 In: 2040 [P.O.:240; I.V.:1800] Out: 800 [Urine:700; Blood:100]  Labs: No results for input(s): WBC, RBC, HCT, PLT in the last 72 hours. No results for input(s): NA, K, CL, CO2, BUN, CREATININE, GLUCOSE, CALCIUM in the last 72 hours. No results for input(s): LABPT, INR in the last 72 hours.  Physical Exam: Neurologically intact ABD soft Intact pulses distally Incision: dressing C/D/I and no drainage Compartment soft  Assessment/Plan: Patient stable  xrays pending Continue mobilization with physical therapy Continue care  Advance diet Up with therapy D/C IV fluids Plan for discharge tomorrow  Vascular studies negative.  Start lovenox today Mag citrate for BM   Melina Schools, MD [place] [phone]

## 2014-11-17 MED ORDER — ENOXAPARIN SODIUM 30 MG/0.3ML ~~LOC~~ SOLN: 30.0000 mg | Freq: Two times a day (BID) | SUBCUTANEOUS | Status: DC

## 2014-11-17 MED ORDER — OXYCODONE-ACETAMINOPHEN 10-325 MG PO TABS: 1.0000 | ORAL_TABLET | ORAL | Status: DC | PRN

## 2014-11-17 MED ORDER — METHOCARBAMOL 500 MG PO TABS: 500.0000 mg | ORAL_TABLET | Freq: Three times a day (TID) | ORAL | Status: DC | PRN

## 2014-11-17 MED ORDER — ONDANSETRON HCL 4 MG PO TABS: 4.0000 mg | ORAL_TABLET | Freq: Three times a day (TID) | ORAL | Status: DC | PRN

## 2014-11-17 MED ORDER — POLYETHYLENE GLYCOL 3350 17 GM/SCOOP PO POWD: 17.0000 g | Freq: Every day | ORAL | Status: DC

## 2014-11-17 NOTE — Progress Notes (Signed)
Pt and mother given D/C instructions with Rx's, verbal understanding was provided. Pt's incision is covered with dressing and is clean and dry, with no sign of infection. Pt's IV was removed prior to D/C. Pt D/C'd home via walking @ 1455 per MD order. Pt is stable @ D/C and has no other needs at this time. Holli Humbles, RN

## 2014-11-17 NOTE — Progress Notes (Signed)
    Subjective: Procedure(s) (LRB): ALIF L5-S1 (N/A) ABDOMINAL EXPOSURE (N/A) 2 Days Post-Op  Patient reports pain as 2 on 0-10 scale.  Reports decreased leg pain reports incisional back pain   Positive void Negative bowel movement Positive flatus Negative chest pain or shortness of breath  Objective: Vital signs in last 24 hours: Temp:  [98.2 F (36.8 C)-98.9 F (37.2 C)] 98.2 F (36.8 C) (01/08 0749) Pulse Rate:  [80-98] 81 (01/08 0749) Resp:  [18-20] 18 (01/08 0749) BP: (129-141)/(73-97) 133/85 mmHg (01/08 0749) SpO2:  [92 %-95 %] 92 % (01/08 0749)  Intake/Output from previous day: 01/07 0701 - 01/08 0700 In: 480 [P.O.:480] Out: -   Labs: No results for input(s): WBC, RBC, HCT, PLT in the last 72 hours. No results for input(s): NA, K, CL, CO2, BUN, CREATININE, GLUCOSE, CALCIUM in the last 72 hours. No results for input(s): LABPT, INR in the last 72 hours.  Physical Exam: Neurologically intact ABD soft Intact pulses distally Dorsiflexion/Plantar flexion intact Incision: dressing C/D/I and no drainage Compartment soft  Assessment/Plan: Patient stable  xrays satisfactory Continue mobilization with physical therapy Continue care  Advance diet Up with therapy  Doing well.  Abd soft/NT - will complete 1/2 bottle of Mag citrate for constipation.  No evidence of ileus at this time Plan on d/c this afternoon F/u 2 weeks  Melina Schools, MD [place] [phone]

## 2014-11-17 NOTE — Discharge Summary (Signed)
Patient ID: Nathan [name] MRN: [id number] DOB/AGE: Aug 26, 1966 49 y.o.  Admit date: 11/15/2014 Discharge date: 11/17/2014  Admission Diagnoses:  Active Problems:   Back pain   Discharge Diagnoses:  Active Problems:   Back pain  status post Procedure(s): ALIF L5-S1 ABDOMINAL EXPOSURE  Past Medical History  Diagnosis Date  . Hypertension   . GERD (gastroesophageal reflux disease)   . Seasonal allergies   . Anxiety   . Arthritis     back    Surgeries: Procedure(s): ALIF L5-S1 ABDOMINAL EXPOSURE on 11/15/2014   Consultants:    Discharged Condition: Improved  Hospital Course: Nathan [name] is an 49 y.o. male who was admitted 11/15/2014 for operative treatment of back pain. Patient failed conservative treatments (please see the history and physical for the specifics) and had severe unremitting pain that affects sleep, daily activities and work/hobbies. After pre-op clearance, the patient was taken to the operating room on 11/15/2014 and underwent  Procedure(s): ALIF L5-S1 ABDOMINAL EXPOSURE.    Patient was given perioperative antibiotics: Anti-infectives    Start     Dose/Rate Route Frequency Ordered Stop   11/15/14 1500  ceFAZolin (ANCEF) IVPB 1 g/50 mL premix     1 g100 mL/hr over 30 Minutes Intravenous Every 8 hours 11/15/14 1325 11/15/14 2341   11/14/14 1359  ceFAZolin (ANCEF) IVPB 2 g/50 mL premix     2 g100 mL/hr over 30 Minutes Intravenous 30 min pre-op 11/14/14 1359 11/15/14 0855       Patient was given sequential compression devices and early ambulation to prevent DVT.   Patient benefited maximally from hospital stay and there were no complications. At the time of discharge, the patient was urinating/moving their bowels without difficulty, tolerating a regular diet, pain is controlled with oral pain medications and they have been cleared by PT/OT.   Recent vital signs: Patient Vitals for the past 24 hrs:  BP Temp Temp src Pulse Resp SpO2  11/17/14 0749  133/85 mmHg 98.2 F (36.8 C) - 81 18 92 %  11/17/14 0430 129/73 mmHg 98.3 F (36.8 C) Oral 80 20 95 %  11/16/14 2348 130/79 mmHg 98.5 F (36.9 C) Oral 82 20 94 %  11/16/14 2030 133/75 mmHg 98.9 F (37.2 C) Oral 93 18 94 %  11/16/14 1702 (!) 141/87 mmHg 98.2 F (36.8 C) - 94 18 95 %  11/16/14 1146 130/76 mmHg 98.3 F (36.8 C) - 91 18 94 %  11/16/14 0830 (!) 133/97 mmHg 98.4 F (36.9 C) - 98 18 95 %     Recent laboratory studies: No results for input(s): WBC, HGB, HCT, PLT, NA, K, CL, CO2, BUN, CREATININE, GLUCOSE, INR, CALCIUM in the last 72 hours.  Invalid input(s): PT, 2   Discharge Medications:     Medication List    STOP taking these medications        HYDROcodone-acetaminophen 5-325 MG per tablet  Commonly known as:  NORCO/VICODIN     ibuprofen 200 MG tablet  Commonly known as:  ADVIL,MOTRIN      TAKE these medications        amLODipine 10 MG tablet  Commonly known as:  NORVASC  Take 10 mg by mouth daily.     azelastine 0.1 % nasal spray  Commonly known as:  ASTELIN  Place 2 sprays into the nose 2 (two) times daily. Use in each nostril as directed     calcium carbonate-magnesium hydroxide 334 MG Chew  Commonly known as:  ROLAIDS  Chew 1 tablet by mouth 2 (two) times daily as needed. For acid reflux     enoxaparin 30 MG/0.3ML injection  Commonly known as:  LOVENOX  Inject 0.3 mLs (30 mg total) into the skin every 12 (twelve) hours. 10 day supply     fluticasone 50 MCG/ACT nasal spray  Commonly known as:  FLONASE  Place 2 sprays into the nose daily.     latanoprost 0.005 % ophthalmic solution  Commonly known as:  XALATAN  Place 1 drop into the right eye at bedtime.     methocarbamol 500 MG tablet  Commonly known as:  ROBAXIN  Take 1 tablet (500 mg total) by mouth 3 (three) times daily as needed for muscle spasms.     metoprolol tartrate 25 MG tablet  Commonly known as:  LOPRESSOR  Take 1 tablet by mouth 2 (two) times daily.     ondansetron 4 MG  tablet  Commonly known as:  ZOFRAN  Take 1 tablet (4 mg total) by mouth every 8 (eight) hours as needed for nausea or vomiting.     oxyCODONE-acetaminophen 10-325 MG per tablet  Commonly known as:  PERCOCET  Take 1 tablet by mouth every 4 (four) hours as needed for pain.     polyethylene glycol powder powder  Commonly known as:  GLYCOLAX  Take 17 g by mouth daily.     PRESERVISION AREDS 2 Caps  Take 1 capsule by mouth 2 (two) times daily.     sertraline 50 MG tablet  Commonly known as:  ZOLOFT  Take 1 tablet by mouth daily.        Diagnostic Studies: Dg Lumbar Spine 2-3 Views  11/16/2014   CLINICAL DATA:  49 year old male status post anterior lumbar fusion. Initial encounter.  EXAM: LUMBAR SPINE - 2-3 VIEW  COMPARISON:  11/15/2014 intraoperative and postoperative films, and earlier.  FINDINGS: Anterior interbody implant re- identified at L5-S1. Hardware appears stable from the intraoperative images. Vertebral height and alignment elsewhere is stable. Normal lumbar segmentation. Increased gas-filled but non dilated bowel loops throughout the abdomen.  IMPRESSION: Anterior interbody implant placement at L5-S1 with no adverse features.   Electronically Signed   By: Lars Pinks M.D.   On: 11/16/2014 07:50   Dg Lumbar Spine 2-3 Views  11/15/2014   CLINICAL DATA:  Anterior lumbar fusion at L5-S1. 33 seconds of fluoro time reported  EXAM: DG C-ARM 61-120 MIN; LUMBAR SPINE - 2-3 VIEW  COMPARISON:  Lumbar spine series of November 07, 2014.  FINDINGS: The patient has undergone inter discal device placement and anterior fusion at L5-S1. Radiographic positioning of the prosthetic components is good. There is antro listhesis or retrolisthesis of L5 with respect to S1.  IMPRESSION: The patient has undergone L5-S1 fusion with intradiscal device placement without evidence of immediate postprocedure complication.   Electronically Signed   By: David  Martinique   On: 11/15/2014 11:23   Dg Lumbar Spine 2-3  Views  11/07/2014   CLINICAL DATA:  Degenerative disc disease and preoperative films prior to planned diskectomy and lumbar fusion at L5-S1.  EXAM: LUMBAR SPINE - 2-3 VIEW  COMPARISON:  None.  FINDINGS: The lumbar spine shows normal alignment. There is moderate disc space narrowing at L5-S1 with associated endplate proliferative changes. No listhesis is evident. Moderate facet hypertrophy present at L4-5 and L5-S1 bilaterally. No other significant spondylosis at other lumbar levels. No bony lesions or fractures identified.  IMPRESSION: Moderate spondylosis at L5-S1.  No associated listhesis.   Electronically  Signed   By: Aletta Edouard M.D.   On: 11/07/2014 16:17   Dg C-arm 1-60 Min  11/15/2014   CLINICAL DATA:  Anterior lumbar fusion at L5-S1. 33 seconds of fluoro time reported  EXAM: DG C-ARM 61-120 MIN; LUMBAR SPINE - 2-3 VIEW  COMPARISON:  Lumbar spine series of November 07, 2014.  FINDINGS: The patient has undergone inter discal device placement and anterior fusion at L5-S1. Radiographic positioning of the prosthetic components is good. There is antro listhesis or retrolisthesis of L5 with respect to S1.  IMPRESSION: The patient has undergone L5-S1 fusion with intradiscal device placement without evidence of immediate postprocedure complication.   Electronically Signed   By: David  Martinique   On: 11/15/2014 11:23   Dg Or Local Abdomen  11/15/2014   CLINICAL DATA:  Lumbar degenerative disc disease status post anterior lumbar fusion. Instrument count.  EXAM: OR LOCAL ABDOMEN  COMPARISON:  Lumbar spine radiographs 11/07/2014  FINDINGS: Sequelae of interval instrumented fusion are identified at L5-S1. No other retained surgical instruments are identified. Gas is partially visualized in nondilated loops of bowel. Pelvic phleboliths are noted.  IMPRESSION: L5-S1 fusion.  No retained surgical instruments identified.  These results were called by telephone at the time of interpretation on 11/15/2014 at 10:55 am to  [facility] in the [place], who verbally acknowledged these results.   Electronically Signed   By: Logan Bores   On: 11/15/2014 10:55          Follow-up Information    Follow up with Dahlia Bailiff, MD. Schedule an appointment as soon as possible for a visit in 2 weeks.   Specialty:  Orthopedic Surgery   Why:  For suture removal, For wound re-check   Contact information:   [address] [place] [address] [place] [address] [phone]       Discharge Plan:  discharge to home  Disposition: doing well.  Ambulation and use of incentive spirometer encouraged.   Ok to shower in 5 days   F/u 2 weeks for re-check    Signed: Melina Schools D for Dr. Melina Schools [facility] [phone] 11/17/2014, 7:58 AM

## 2017-12-24 DIAGNOSIS — J320 Chronic maxillary sinusitis: Secondary | ICD-10-CM | POA: Diagnosis not present

## 2018-01-06 DIAGNOSIS — Z719 Counseling, unspecified: Secondary | ICD-10-CM | POA: Diagnosis not present

## 2018-01-21 DIAGNOSIS — Z719 Counseling, unspecified: Secondary | ICD-10-CM | POA: Diagnosis not present

## 2018-01-28 DIAGNOSIS — Z719 Counseling, unspecified: Secondary | ICD-10-CM | POA: Diagnosis not present

## 2018-02-04 DIAGNOSIS — Z719 Counseling, unspecified: Secondary | ICD-10-CM | POA: Diagnosis not present

## 2018-02-11 DIAGNOSIS — Z719 Counseling, unspecified: Secondary | ICD-10-CM | POA: Diagnosis not present

## 2018-02-18 DIAGNOSIS — Z719 Counseling, unspecified: Secondary | ICD-10-CM | POA: Diagnosis not present

## 2018-02-25 DIAGNOSIS — Z719 Counseling, unspecified: Secondary | ICD-10-CM | POA: Diagnosis not present

## 2018-03-04 DIAGNOSIS — Z719 Counseling, unspecified: Secondary | ICD-10-CM | POA: Diagnosis not present

## 2018-03-11 DIAGNOSIS — Z719 Counseling, unspecified: Secondary | ICD-10-CM | POA: Diagnosis not present

## 2018-03-18 DIAGNOSIS — Z719 Counseling, unspecified: Secondary | ICD-10-CM | POA: Diagnosis not present

## 2018-03-25 DIAGNOSIS — Z719 Counseling, unspecified: Secondary | ICD-10-CM | POA: Diagnosis not present

## 2018-04-19 DIAGNOSIS — L290 Pruritus ani: Secondary | ICD-10-CM | POA: Diagnosis not present

## 2018-04-19 DIAGNOSIS — Z1211 Encounter for screening for malignant neoplasm of colon: Secondary | ICD-10-CM | POA: Diagnosis not present

## 2018-04-20 ENCOUNTER — Encounter: Payer: Self-pay | Admitting: Gastroenterology

## 2018-06-15 ENCOUNTER — Ambulatory Visit (AMBULATORY_SURGERY_CENTER): Payer: Self-pay | Admitting: *Deleted

## 2018-06-15 VITALS — Ht 68.0 in | Wt 203.0 lb

## 2018-06-15 DIAGNOSIS — Z1211 Encounter for screening for malignant neoplasm of colon: Secondary | ICD-10-CM

## 2018-06-15 MED ORDER — SUPREP BOWEL PREP KIT 17.5-3.13-1.6 GM/177ML PO SOLN: 1.0000 | Freq: Once | ORAL | 0 refills | Status: AC

## 2018-06-15 NOTE — Progress Notes (Signed)
No egg or soy allergy known to patient  No issues with past sedation with any surgeries  or procedures, no intubation problems  No diet pills per patient No home 02 use per patient  No blood thinners per patient  Pt denies issues with constipation  No A fib or A flutter  EMMI video sent to pt's e mail  

## 2018-06-25 DIAGNOSIS — M25521 Pain in right elbow: Secondary | ICD-10-CM | POA: Diagnosis not present

## 2018-06-25 DIAGNOSIS — M7711 Lateral epicondylitis, right elbow: Secondary | ICD-10-CM | POA: Diagnosis not present

## 2018-06-25 DIAGNOSIS — M25511 Pain in right shoulder: Secondary | ICD-10-CM | POA: Diagnosis not present

## 2018-06-28 ENCOUNTER — Ambulatory Visit (AMBULATORY_SURGERY_CENTER): Payer: 59 | Admitting: Gastroenterology

## 2018-06-28 ENCOUNTER — Encounter: Payer: Self-pay | Admitting: Gastroenterology

## 2018-06-28 VITALS — BP 121/76 | HR 50 | Temp 98.0°F | Resp 16 | Ht 69.0 in | Wt 218.0 lb

## 2018-06-28 DIAGNOSIS — Z1211 Encounter for screening for malignant neoplasm of colon: Secondary | ICD-10-CM

## 2018-06-28 DIAGNOSIS — D123 Benign neoplasm of transverse colon: Secondary | ICD-10-CM

## 2018-06-28 MED ORDER — SODIUM CHLORIDE 0.9 % IV SOLN: 500.0000 mL | Freq: Once | INTRAVENOUS | Status: DC

## 2018-06-28 NOTE — Op Note (Signed)
[facility] Patient Name: Nathan [name] Procedure Date: 06/28/2018 8:01 AM MRN: [id number] Endoscopist: Mauri Pole , MD Age: 52 Referring MD:  Date of Birth: [birth year] Gender: Male Account #: [id number] Procedure:                Colonoscopy Indications:              Screening for colorectal malignant neoplasm Medicines:                Monitored Anesthesia Care Procedure:                Pre-Anesthesia Assessment:                           - Prior to the procedure, a History and Physical                            was performed, and patient medications and                            allergies were reviewed. The patient's tolerance of                            previous anesthesia was also reviewed. The risks                            and benefits of the procedure and the sedation                            options and risks were discussed with the patient.                            All questions were answered, and informed consent                            was obtained. Prior Anticoagulants: The patient has                            taken no previous anticoagulant or antiplatelet                            agents. ASA Grade Assessment: II - A patient with                            mild systemic disease. After reviewing the risks                            and benefits, the patient was deemed in                            satisfactory condition to undergo the procedure.                           After obtaining informed consent, the colonoscope  was passed under direct vision. Throughout the                            procedure, the patient's blood pressure, pulse, and                            oxygen saturations were monitored continuously. The                            Colonoscope was introduced through the anus and                            advanced to the the cecum, identified by                            appendiceal orifice and  ileocecal valve. The                            colonoscopy was performed without difficulty. The                            patient tolerated the procedure well. The quality                            of the bowel preparation was excellent. The                            ileocecal valve, appendiceal orifice, and rectum                            were photographed. Scope In: 8:04:34 AM Scope Out: 8:17:49 AM Scope Withdrawal Time: 0 hours 8 minutes 22 seconds  Total Procedure Duration: 0 hours 13 minutes 15 seconds  Findings:                 The perianal and digital rectal examinations were                            normal.                           Two sessile polyps were found in the transverse                            colon. The polyps were 5 to 7 mm in size. These                            polyps were removed with a cold snare. Resection                            and retrieval were complete.                           Multiple small and large-mouthed diverticula were  found in the sigmoid colon and descending colon.                           Non-bleeding internal hemorrhoids were found during                            retroflexion. The hemorrhoids were small. Complications:            No immediate complications. Estimated Blood Loss:     Estimated blood loss was minimal. Impression:               - Two 5 to 7 mm polyps in the transverse colon,                            removed with a cold snare. Resected and retrieved.                           - Diverticulosis in the sigmoid colon and in the                            descending colon.                           - Non-bleeding internal hemorrhoids. Recommendation:           - Patient has a contact number available for                            emergencies. The signs and symptoms of potential                            delayed complications were discussed with the                            patient.  Return to normal activities tomorrow.                            Written discharge instructions were provided to the                            patient.                           - Resume previous diet.                           - Continue present medications.                           - Await pathology results.                           - Repeat colonoscopy in 5-10 years for surveillance                            based on pathology results. Mauri Pole, MD 06/28/2018 8:24:52 AM This report  has been signed electronically.

## 2018-06-28 NOTE — Progress Notes (Signed)
Called to room to assist during endoscopic procedure.  Patient ID and intended procedure confirmed with present staff. Received instructions for my participation in the procedure from the performing physician.  

## 2018-06-28 NOTE — Patient Instructions (Signed)
INFORMATION ON POLYPS, DIVERTICULOSIS AND HEMORRHOIDS GIVEN.    YOU HAD AN ENDOSCOPIC PROCEDURE TODAY AT [facility]:   Refer to the procedure report that was given to you for any specific questions about what was found during the examination.  If the procedure report does not answer your questions, please call your gastroenterologist to clarify.  If you requested that your care partner not be given the details of your procedure findings, then the procedure report has been included in a sealed envelope for you to review at your convenience later.  YOU SHOULD EXPECT: Some feelings of bloating in the abdomen. Passage of more gas than usual.  Walking can help get rid of the air that was put into your GI tract during the procedure and reduce the bloating. If you had a lower endoscopy (such as a colonoscopy or flexible sigmoidoscopy) you may notice spotting of blood in your stool or on the toilet paper. If you underwent a bowel prep for your procedure, you may not have a normal bowel movement for a few days.  Please Note:  You might notice some irritation and congestion in your nose or some drainage.  This is from the oxygen used during your procedure.  There is no need for concern and it should clear up in a day or so.  SYMPTOMS TO REPORT IMMEDIATELY:   Following lower endoscopy (colonoscopy or flexible sigmoidoscopy):  Excessive amounts of blood in the stool  Significant tenderness or worsening of abdominal pains  Swelling of the abdomen that is new, acute  Fever of 100F or higher   For urgent or emergent issues, a gastroenterologist can be reached at any hour by calling [phone].   DIET:  We do recommend a small meal at first, but then you may proceed to your regular diet.  Drink plenty of fluids but you should avoid alcoholic beverages for 24 hours.  ACTIVITY:  You should plan to take it easy for the rest of today and you should NOT DRIVE or use heavy machinery until  tomorrow (because of the sedation medicines used during the test).    FOLLOW UP: Our staff will call the number listed on your records the next business day following your procedure to check on you and address any questions or concerns that you may have regarding the information given to you following your procedure. If we do not reach you, we will leave a message.  However, if you are feeling well and you are not experiencing any problems, there is no need to return our call.  We will assume that you have returned to your regular daily activities without incident.  If any biopsies were taken you will be contacted by phone or by letter within the next 1-3 weeks.  Please call us at [phone] if you have not heard about the biopsies in 3 weeks.    SIGNATURES/CONFIDENTIALITY: You and/or your care partner have signed paperwork which will be entered into your electronic medical record.  These signatures attest to the fact that that the information above on your After Visit Summary has been reviewed and is understood.  Full responsibility of the confidentiality of this discharge information lies with you and/or your care-partner.

## 2018-06-28 NOTE — Progress Notes (Signed)
Report to PACU, RN, vss, BBS= Clear.  

## 2018-06-29 ENCOUNTER — Telehealth: Payer: Self-pay

## 2018-06-29 NOTE — Telephone Encounter (Signed)
  Follow up Call-  Call back number 06/28/2018  Post procedure Call Back phone  # [phone]  Permission to leave phone message Yes  Some recent data might be hidden     Patient questions:  Do you have a fever, pain , or abdominal swelling? No. Pain Score  0 *  Have you tolerated food without any problems? Yes.    Have you been able to return to your normal activities? Yes.    Do you have any questions about your discharge instructions: Diet   No. Medications  No. Follow up visit  No.  Do you have questions or concerns about your Care? No.  Actions: * If pain score is 4 or above: No action needed, pain <4.

## 2018-07-06 ENCOUNTER — Encounter: Payer: Self-pay | Admitting: Gastroenterology

## 2018-07-16 DIAGNOSIS — M25511 Pain in right shoulder: Secondary | ICD-10-CM | POA: Diagnosis not present

## 2018-07-16 DIAGNOSIS — M25521 Pain in right elbow: Secondary | ICD-10-CM | POA: Diagnosis not present

## 2018-07-23 DIAGNOSIS — M25511 Pain in right shoulder: Secondary | ICD-10-CM | POA: Diagnosis not present

## 2018-07-29 DIAGNOSIS — M25511 Pain in right shoulder: Secondary | ICD-10-CM | POA: Diagnosis not present

## 2018-08-09 DIAGNOSIS — M25511 Pain in right shoulder: Secondary | ICD-10-CM | POA: Diagnosis not present

## 2018-08-09 DIAGNOSIS — M75101 Unspecified rotator cuff tear or rupture of right shoulder, not specified as traumatic: Secondary | ICD-10-CM | POA: Diagnosis not present

## 2018-09-10 DIAGNOSIS — S46011A Strain of muscle(s) and tendon(s) of the rotator cuff of right shoulder, initial encounter: Secondary | ICD-10-CM | POA: Diagnosis not present

## 2018-09-10 DIAGNOSIS — R600 Localized edema: Secondary | ICD-10-CM | POA: Diagnosis not present

## 2018-09-10 DIAGNOSIS — G8918 Other acute postprocedural pain: Secondary | ICD-10-CM | POA: Diagnosis not present

## 2018-09-10 DIAGNOSIS — M19011 Primary osteoarthritis, right shoulder: Secondary | ICD-10-CM | POA: Diagnosis not present

## 2018-09-10 DIAGNOSIS — M75121 Complete rotator cuff tear or rupture of right shoulder, not specified as traumatic: Secondary | ICD-10-CM | POA: Diagnosis not present

## 2018-09-10 DIAGNOSIS — M7541 Impingement syndrome of right shoulder: Secondary | ICD-10-CM | POA: Diagnosis not present

## 2018-09-20 DIAGNOSIS — M25611 Stiffness of right shoulder, not elsewhere classified: Secondary | ICD-10-CM | POA: Diagnosis not present

## 2018-09-28 DIAGNOSIS — M25611 Stiffness of right shoulder, not elsewhere classified: Secondary | ICD-10-CM | POA: Diagnosis not present

## 2018-10-04 DIAGNOSIS — R600 Localized edema: Secondary | ICD-10-CM | POA: Diagnosis not present

## 2018-10-04 DIAGNOSIS — M19011 Primary osteoarthritis, right shoulder: Secondary | ICD-10-CM | POA: Diagnosis not present

## 2018-10-05 DIAGNOSIS — M25611 Stiffness of right shoulder, not elsewhere classified: Secondary | ICD-10-CM | POA: Diagnosis not present

## 2018-10-12 DIAGNOSIS — M25611 Stiffness of right shoulder, not elsewhere classified: Secondary | ICD-10-CM | POA: Diagnosis not present

## 2018-10-18 DIAGNOSIS — M25611 Stiffness of right shoulder, not elsewhere classified: Secondary | ICD-10-CM | POA: Diagnosis not present

## 2018-10-21 DIAGNOSIS — M25611 Stiffness of right shoulder, not elsewhere classified: Secondary | ICD-10-CM | POA: Diagnosis not present

## 2018-10-26 DIAGNOSIS — M25611 Stiffness of right shoulder, not elsewhere classified: Secondary | ICD-10-CM | POA: Diagnosis not present

## 2018-10-28 DIAGNOSIS — M25611 Stiffness of right shoulder, not elsewhere classified: Secondary | ICD-10-CM | POA: Diagnosis not present

## 2018-11-02 DIAGNOSIS — M25611 Stiffness of right shoulder, not elsewhere classified: Secondary | ICD-10-CM | POA: Diagnosis not present

## 2018-11-04 DIAGNOSIS — M25611 Stiffness of right shoulder, not elsewhere classified: Secondary | ICD-10-CM | POA: Diagnosis not present

## 2018-11-08 DIAGNOSIS — M25611 Stiffness of right shoulder, not elsewhere classified: Secondary | ICD-10-CM | POA: Diagnosis not present

## 2018-11-15 DIAGNOSIS — M25611 Stiffness of right shoulder, not elsewhere classified: Secondary | ICD-10-CM | POA: Diagnosis not present

## 2018-11-23 DIAGNOSIS — M25611 Stiffness of right shoulder, not elsewhere classified: Secondary | ICD-10-CM | POA: Diagnosis not present

## 2018-12-01 DIAGNOSIS — M25611 Stiffness of right shoulder, not elsewhere classified: Secondary | ICD-10-CM | POA: Diagnosis not present

## 2018-12-07 DIAGNOSIS — M25611 Stiffness of right shoulder, not elsewhere classified: Secondary | ICD-10-CM | POA: Diagnosis not present

## 2019-09-26 ENCOUNTER — Other Ambulatory Visit: Payer: Self-pay | Admitting: *Deleted

## 2019-09-26 DIAGNOSIS — Z20822 Contact with and (suspected) exposure to covid-19: Secondary | ICD-10-CM

## 2019-09-28 LAB — NOVEL CORONAVIRUS, NAA: SARS-CoV-2, NAA: NOT DETECTED

## 2020-01-23 ENCOUNTER — Other Ambulatory Visit: Payer: Self-pay | Admitting: Orthopedic Surgery

## 2020-01-23 DIAGNOSIS — M259 Joint disorder, unspecified: Secondary | ICD-10-CM

## 2020-02-01 ENCOUNTER — Ambulatory Visit
Admission: RE | Admit: 2020-02-01 | Discharge: 2020-02-01 | Disposition: A | Discharge status: Home / Self Care | Payer: 59 | Source: Ambulatory Visit | Attending: Orthopedic Surgery | Admitting: Orthopedic Surgery

## 2020-02-01 ENCOUNTER — Other Ambulatory Visit: Payer: Self-pay

## 2020-02-01 DIAGNOSIS — M259 Joint disorder, unspecified: Secondary | ICD-10-CM

## 2020-02-07 ENCOUNTER — Other Ambulatory Visit: Payer: Self-pay | Admitting: Orthopedic Surgery

## 2020-02-07 DIAGNOSIS — M259 Joint disorder, unspecified: Secondary | ICD-10-CM

## 2020-02-22 ENCOUNTER — Ambulatory Visit
Admission: RE | Admit: 2020-02-22 | Discharge: 2020-02-22 | Disposition: A | Discharge status: Home / Self Care | Payer: 59 | Source: Ambulatory Visit | Attending: Orthopedic Surgery | Admitting: Orthopedic Surgery

## 2020-02-22 ENCOUNTER — Other Ambulatory Visit: Payer: Self-pay

## 2020-02-22 DIAGNOSIS — M259 Joint disorder, unspecified: Secondary | ICD-10-CM

## 2020-02-22 MED ORDER — METHYLPREDNISOLONE ACETATE 40 MG/ML INJ SUSP (RADIOLOG: 120.0000 mg | Freq: Once | INTRAMUSCULAR | Status: DC

## 2021-04-01 ENCOUNTER — Encounter (HOSPITAL_COMMUNITY): Payer: Self-pay

## 2021-04-01 ENCOUNTER — Other Ambulatory Visit: Payer: Self-pay

## 2021-04-01 ENCOUNTER — Emergency Department (HOSPITAL_COMMUNITY)
Admission: EM | Admit: 2021-04-01 | Discharge: 2021-04-01 | Disposition: A | Discharge status: Home / Self Care | Payer: 59 | Attending: Emergency Medicine | Admitting: Emergency Medicine

## 2021-04-01 DIAGNOSIS — J029 Acute pharyngitis, unspecified: Secondary | ICD-10-CM | POA: Diagnosis present

## 2021-04-01 DIAGNOSIS — K122 Cellulitis and abscess of mouth: Secondary | ICD-10-CM | POA: Diagnosis not present

## 2021-04-01 DIAGNOSIS — Z79899 Other long term (current) drug therapy: Secondary | ICD-10-CM | POA: Insufficient documentation

## 2021-04-01 DIAGNOSIS — I10 Essential (primary) hypertension: Secondary | ICD-10-CM | POA: Diagnosis not present

## 2021-04-01 DIAGNOSIS — Z20822 Contact with and (suspected) exposure to covid-19: Secondary | ICD-10-CM | POA: Diagnosis not present

## 2021-04-01 LAB — SARS CORONAVIRUS 2 (TAT 6-24 HRS): SARS Coronavirus 2: NEGATIVE

## 2021-04-01 LAB — GROUP A STREP BY PCR: Group A Strep by PCR: NOT DETECTED

## 2021-04-01 MED ORDER — METHYLPREDNISOLONE 4 MG PO TBPK: ORAL_TABLET | ORAL | 0 refills | Status: DC

## 2021-04-01 MED ORDER — DEXAMETHASONE SODIUM PHOSPHATE 10 MG/ML IJ SOLN
10.0000 mg | Freq: Once | INTRAMUSCULAR | Status: AC
  Administered 2021-04-01: 10 mg via INTRAMUSCULAR
  Filled 2021-04-01: qty 1

## 2021-04-01 MED ORDER — CLINDAMYCIN HCL 150 MG PO CAPS
300.0000 mg | ORAL_CAPSULE | Freq: Once | ORAL | Status: AC
  Administered 2021-04-01: 300 mg via ORAL
  Filled 2021-04-01: qty 2

## 2021-04-01 MED ORDER — CLINDAMYCIN HCL 150 MG PO CAPS: 300.0000 mg | ORAL_CAPSULE | Freq: Three times a day (TID) | ORAL | 0 refills | Status: AC

## 2021-04-01 NOTE — ED Provider Notes (Signed)
Emergency Medicine Provider Triage Evaluation Note  JOSHUAJAMES MOEHRING , a 55 y.o. male  was evaluated in triage.  Pt complains of sore throat.  Sudden onset.  Was normal tonight before bed.  Feels like he has swelling.  Awakened him from sleep.  Review of Systems  Positive: Sore throat Negative: Fever, cough  Physical Exam  BP (!) 176/109 (BP Location: Left Arm)   Pulse 84   Temp 98.4 F (36.9 C) (Oral)   Ht 5\' 9"  (1.753 m)   Wt 98.9 kg   SpO2 100%   BMI 32.20 kg/m  Gen:   Awake, no distress   Resp:  Normal effort  MSK:   Moves extremities without difficulty  Other:  Swollen erythematous uvula  Medical Decision Making  Medically screening exam initiated at 1:04 AM.  Appropriate orders placed.  MARQUEZE RAMCHARAN was informed that the remainder of the evaluation will be completed by another provider, this initial triage assessment does not replace that evaluation, and the importance of remaining in the ED until their evaluation is complete.     Montine Circle, PA-C 14/97/02 [id number]    Delora Fuel, MD 58/85/02 [phone]

## 2021-04-01 NOTE — ED Notes (Signed)
Pt. Blood pressure high, RN & PA notified

## 2021-04-01 NOTE — ED Triage Notes (Signed)
Woke up tonight to having sore throat.   Denies any fever, chills, and shortness of breath.

## 2021-04-01 NOTE — Discharge Instructions (Signed)
Contact a health care provider if: You have a fever. You have trouble eating. Your symptoms do not get better. Your symptoms come back after treatment. Get help right away if: You have trouble breathing. You have trouble swallowing. 

## 2021-04-01 NOTE — ED Provider Notes (Signed)
[name] DEPART[facility] Provider Note   CSN: [id number] Arrival date & time: 04/01/21  0055     History Chief Complaint  Patient presents with  . Sore Throat    Nathan [name] is a 55 y.o. male who presents with cc of sore throat. He had onset of pain and swelling in the [place] overnight which woke him from sleep.  He has some pain with swallowing.  He was given a dose of clindamycin and steroid upfront with significant improvement in his symptoms although he feels currently after 6 hours that his pain is coming back to him.  He is able to swallow fluids.  He denies fevers, chills, any new medications, other symptoms of allergic reaction.  HPI     Past Medical History:  Diagnosis Date  . Allergy   . Anxiety   . Arthritis    back  . GERD (gastroesophageal reflux disease)   . Glaucoma   . Hypertension   . Seasonal allergies     Patient Active Problem List   Diagnosis Date Noted  . Back pain 11/15/2014    Past Surgical History:  Procedure Laterality Date  . ABDOMINAL EXPOSURE N/A 11/15/2014   Procedure: ABDOMINAL EXPOSURE;  Surgeon: Rosetta Posner, MD;  Location: [facility];  Service: Vascular;  Laterality: N/A;  . ANTERIOR LUMBAR FUSION N/A 11/15/2014   Procedure: ALIF L5-S1;  Surgeon: Melina Schools, MD;  Location: [facility];  Service: Orthopedics;  Laterality: N/A;  . SINUS ENDO W/FUSION    . WISDOM TOOTH EXTRACTION         Family History  Problem Relation Age of Onset  . Diabetes Mother   . Hypertension Mother   . Heart disease Father        BEFORE AGE 86  . Hypertension Father   . Heart attack Father   . Colon polyps Father   . Diabetes Sister   . Hypertension Sister   . Colon cancer Neg Hx   . Esophageal cancer Neg Hx   . Pancreatic cancer Neg Hx   . Rectal cancer Neg Hx   . Stomach cancer Neg Hx     Social History   Tobacco Use  . Smoking status: Never Smoker  . Smokeless tobacco: Never Used  Vaping Use  . Vaping Use: Never used   Substance Use Topics  . Alcohol use: Yes    Alcohol/week: 8.0 standard drinks    Types: 8 Cans of beer per week  . Drug use: No    Home Medications Prior to Admission medications   Medication Sig Start Date End Date Taking? Authorizing Provider  clindamycin (CLEOCIN) 150 MG capsule Take 2 capsules (300 mg total) by mouth 3 (three) times daily for 5 days. 04/01/21 04/06/21 Yes Kadir Azucena, Vernie Shanks, PA-C  methylPREDNISolone (MEDROL DOSEPAK) 4 MG TBPK tablet Use as directed 04/01/21  Yes Mercades Bajaj, PA-C  amLODipine (NORVASC) 10 MG tablet Take 10 mg by mouth daily.    [provider]  azelastine (ASTELIN) 137 MCG/SPRAY nasal spray Place 2 sprays into the nose 2 (two) times daily. Use in each nostril as directed    [provider]  calcium carbonate-magnesium hydroxide (ROLAIDS) 334 MG CHEW Chew 1 tablet by mouth 2 (two) times daily as needed. For acid reflux    [provider]  fluticasone (FLONASE) 50 MCG/ACT nasal spray Place 2 sprays into the nose daily.    [provider]  latanoprost (XALATAN) 0.005 % ophthalmic solution Place 1  drop into the right eye at bedtime.     [provider]  meclizine (ANTIVERT) 25 MG tablet meclizine 25 mg tablet 03/10/16   [provider]  Multiple Vitamins-Minerals (PRESERVISION AREDS 2) CAPS Take 1 capsule by mouth 2 (two) times daily.    [provider]  ranitidine (ZANTAC) 150 MG capsule Take 150 mg by mouth 2 (two) times daily.    [provider]  sertraline (ZOLOFT) 50 MG tablet Take 1 tablet by mouth daily. 09/26/14   [provider]    Allergies    Lisinopril  Review of Systems   Review of Systems Ten systems reviewed and are negative for acute change, except as noted in the HPI.   Physical Exam Updated Vital Signs BP (!) 140/93 (BP Location: Left Arm)   Pulse 66   Temp 98 F (36.7 C) (Oral)   Resp 18   Ht 5\' 9"  (1.753 m)   Wt 98.9 kg   SpO2 98%   BMI 32.20  kg/m   Physical Exam Vitals and nursing note reviewed.  Constitutional:      General: He is not in acute distress.    Appearance: He is well-developed. He is not diaphoretic.  HENT:     Head: Normocephalic and atraumatic.     Right Ear: Tympanic membrane normal.     Left Ear: Tympanic membrane normal.     Mouth/Throat:     Mouth: No oral lesions.     Pharynx: Uvula midline. Posterior oropharyngeal erythema and uvula swelling present. No pharyngeal swelling or oropharyngeal exudate.  Eyes:     General: No scleral icterus.    Conjunctiva/sclera: Conjunctivae normal.  Cardiovascular:     Rate and Rhythm: Normal rate and regular rhythm.     Heart sounds: Normal heart sounds.  Pulmonary:     Effort: Pulmonary effort is normal. No respiratory distress.     Breath sounds: Normal breath sounds.  Abdominal:     Palpations: Abdomen is soft.     Tenderness: There is no abdominal tenderness.  Musculoskeletal:     Cervical back: Normal range of motion and neck supple.  Skin:    General: Skin is warm and dry.  Neurological:     Mental Status: He is alert.  Psychiatric:        Behavior: Behavior normal.     ED Results / Procedures / Treatments   Labs (all labs ordered are listed, but only abnormal results are displayed) Labs Reviewed  SARS CORONAVIRUS 2 (TAT 6-24 HRS)  GROUP A STREP BY PCR    EKG None  Radiology No results found.  Procedures Procedures   Medications Ordered in ED Medications  dexamethasone (DECADRON) injection 10 mg (10 mg Intramuscular Given 04/01/21 0117)  clindamycin (CLEOCIN) capsule 300 mg (300 mg Oral Given 04/01/21 0117)    ED Course  I have reviewed the triage vital signs and the nursing notes.  Pertinent labs & imaging results that were available during my care of the patient were reviewed by me and considered in my medical decision making (see chart for details).    MDM Rules/Calculators/A&P                          Patient with  Uvulitis Negative GAS and COVID-19 Treated earlier with clindamycin ? Cellulitis? Will d/c with same for 5 days and medrol dose pack. Discussed OP f/u, supportive care and return precautions. Final Clinical Impression(s) / ED Diagnoses  Final diagnoses:  Uvulitis    Rx / DC Orders ED Discharge Orders         Ordered    methylPREDNISolone (MEDROL DOSEPAK) 4 MG TBPK tablet        04/01/21 0728    clindamycin (CLEOCIN) 150 MG capsule  3 times daily        04/01/21 0730           Margarita Mail, PA-C 04/01/21 0741    Breck Coons, MD 04/01/21 [phone]

## 2021-09-19 ENCOUNTER — Emergency Department (HOSPITAL_COMMUNITY): Payer: 59

## 2021-09-19 ENCOUNTER — Emergency Department (HOSPITAL_COMMUNITY)
Admission: EM | Admit: 2021-09-19 | Discharge: 2021-09-19 | Disposition: A | Discharge status: Home / Self Care | Payer: 59 | Attending: Emergency Medicine | Admitting: Emergency Medicine

## 2021-09-19 ENCOUNTER — Encounter (HOSPITAL_COMMUNITY): Payer: Self-pay | Admitting: Oncology

## 2021-09-19 ENCOUNTER — Other Ambulatory Visit: Payer: Self-pay

## 2021-09-19 DIAGNOSIS — I10 Essential (primary) hypertension: Secondary | ICD-10-CM | POA: Insufficient documentation

## 2021-09-19 DIAGNOSIS — H53149 Visual discomfort, unspecified: Secondary | ICD-10-CM | POA: Diagnosis not present

## 2021-09-19 DIAGNOSIS — M542 Cervicalgia: Secondary | ICD-10-CM | POA: Diagnosis not present

## 2021-09-19 DIAGNOSIS — R519 Headache, unspecified: Secondary | ICD-10-CM

## 2021-09-19 DIAGNOSIS — Z79899 Other long term (current) drug therapy: Secondary | ICD-10-CM | POA: Insufficient documentation

## 2021-09-19 LAB — CBC WITH DIFFERENTIAL/PLATELET
Abs Immature Granulocytes: 0.03 10*3/uL (ref 0.00–0.07)
Basophils Absolute: 0.1 10*3/uL (ref 0.0–0.1)
Basophils Relative: 1 %
Eosinophils Absolute: 0.2 10*3/uL (ref 0.0–0.5)
Eosinophils Relative: 3 %
HCT: 47.9 % (ref 39.0–52.0)
Hemoglobin: 16.5 g/dL (ref 13.0–17.0)
Immature Granulocytes: 0 %
Lymphocytes Relative: 24 %
Lymphs Abs: 2.1 10*3/uL (ref 0.7–4.0)
MCH: 29.4 pg (ref 26.0–34.0)
MCHC: 34.4 g/dL (ref 30.0–36.0)
MCV: 85.2 fL (ref 80.0–100.0)
Monocytes Absolute: 0.5 10*3/uL (ref 0.1–1.0)
Monocytes Relative: 6 %
Neutro Abs: 5.8 10*3/uL (ref 1.7–7.7)
Neutrophils Relative %: 66 %
Platelets: 226 10*3/uL (ref 150–400)
RBC: 5.62 MIL/uL (ref 4.22–5.81)
RDW: 13.1 % (ref 11.5–15.5)
WBC: 8.8 10*3/uL (ref 4.0–10.5)
nRBC: 0 % (ref 0.0–0.2)

## 2021-09-19 LAB — COMPREHENSIVE METABOLIC PANEL
ALT: 36 U/L (ref 0–44)
AST: 29 U/L (ref 15–41)
Albumin: 5.2 g/dL — ABNORMAL HIGH (ref 3.5–5.0)
Alkaline Phosphatase: 66 U/L (ref 38–126)
Anion gap: 8 (ref 5–15)
BUN: 12 mg/dL (ref 6–20)
CO2: 28 mmol/L (ref 22–32)
Calcium: 9.4 mg/dL (ref 8.9–10.3)
Chloride: 104 mmol/L (ref 98–111)
Creatinine, Ser: 0.59 mg/dL — ABNORMAL LOW (ref 0.61–1.24)
GFR, Estimated: >60 mL/min (ref >60)
Glucose, Bld: 99 mg/dL (ref 70–99)
Potassium: 3.6 mmol/L (ref 3.5–5.1)
Sodium: 140 mmol/L (ref 135–145)
Total Bilirubin: 1.1 mg/dL (ref 0.3–1.2)
Total Protein: 8.3 g/dL — ABNORMAL HIGH (ref 6.5–8.1)

## 2021-09-19 LAB — CSF CELL COUNT WITH DIFFERENTIAL
Other Cells, CSF: NONE SEEN
RBC Count, CSF: 0 /mm3
Tube #: 4
WBC, CSF: 0 /mm3 (ref 0–5)

## 2021-09-19 LAB — PROTEIN AND GLUCOSE, CSF
Glucose, CSF: 61 mg/dL (ref 40–70)
Total  Protein, CSF: 47 mg/dL — ABNORMAL HIGH (ref 15–45)

## 2021-09-19 MED ORDER — ACETAMINOPHEN 325 MG PO TABS
650.0000 mg | ORAL_TABLET | Freq: Once | ORAL | Status: DC
  Filled 2021-09-19: qty 2

## 2021-09-19 MED ORDER — KETOROLAC TROMETHAMINE 15 MG/ML IJ SOLN
15.0000 mg | Freq: Once | INTRAMUSCULAR | Status: AC
  Administered 2021-09-19: 15 mg via INTRAVENOUS
  Filled 2021-09-19: qty 1

## 2021-09-19 MED ORDER — LIDOCAINE HCL (PF) 1 % IJ SOLN
5.0000 mL | Freq: Once | INTRAMUSCULAR | Status: AC
  Administered 2021-09-19: 3.5 mL via INTRADERMAL

## 2021-09-19 MED ORDER — BUTALBITAL-APAP-CAFFEINE 50-325-40 MG PO TABS: 1.0000 | ORAL_TABLET | Freq: Four times a day (QID) | ORAL | 0 refills | Status: AC | PRN

## 2021-09-19 NOTE — Procedures (Signed)
Technically successful fluoroscopically-guided L4-L5 lumbar puncture.   Opening pressure of 21 cm water.   16 mL of CSF obtained for laboratory studies.    No immediate post-procedure complication.

## 2021-09-19 NOTE — ED Provider Notes (Signed)
[facility] DEPT Provider Note   CSN: [id number] Arrival date & time: 09/19/21  1113     History Chief Complaint  Patient presents with   Headache    Nathan [name] is a 55 y.o. male.   Headache Associated symptoms: neck pain and photophobia   Associated symptoms: no abdominal pain and no congestion   Patient told to come in with a headache.  On Sunday discovered a tick in his left pelvic area.  Seen in urgent care and started on doxycycline.  Found to have [facility] spotted fever positive for IgG but negative for IgM.  Been called with results today.  Since developed a headache.  Diffuse dull.  No fevers.  Mild photophobia.  Slight nausea.  Pain with moving his head around.  Does not tend to get headaches.  Does have history of hypertension.  Mild neck pain.  No vomiting but some nausea.  States that the site on his leg where the tick was is improving.    Past Medical History:  Diagnosis Date   Allergy    Anxiety    Arthritis    back   GERD (gastroesophageal reflux disease)    Glaucoma    Hypertension    Seasonal allergies     Patient Active Problem List   Diagnosis Date Noted   Back pain 11/15/2014    Past Surgical History:  Procedure Laterality Date   ABDOMINAL EXPOSURE N/A 11/15/2014   Procedure: ABDOMINAL EXPOSURE;  Surgeon: Rosetta Posner, MD;  Location: [facility] OR;  Service: Vascular;  Laterality: N/A;   ANTERIOR LUMBAR FUSION N/A 11/15/2014   Procedure: ALIF L5-S1;  Surgeon: Melina Schools, MD;  Location: [facility];  Service: Orthopedics;  Laterality: N/A;   SINUS ENDO W/FUSION     WISDOM TOOTH EXTRACTION         Family History  Problem Relation Age of Onset   Diabetes Mother    Hypertension Mother    Heart disease Father        BEFORE AGE 33   Hypertension Father    Heart attack Father    Colon polyps Father    Diabetes Sister    Hypertension Sister    Colon cancer Neg Hx    Esophageal cancer Neg Hx    Pancreatic cancer Neg  Hx    Rectal cancer Neg Hx    Stomach cancer Neg Hx     Social History   Tobacco Use   Smoking status: Never   Smokeless tobacco: Never  Vaping Use   Vaping Use: Never used  Substance Use Topics   Alcohol use: Yes    Alcohol/week: 8.0 standard drinks    Types: 8 Cans of beer per week   Drug use: No    Home Medications Prior to Admission medications   Medication Sig Start Date End Date Taking? Authorizing Provider  acetaminophen (TYLENOL) 500 MG tablet Take 1,000 mg by mouth every 6 (six) hours as needed for mild pain.   Yes [provider]  amLODipine (NORVASC) 10 MG tablet Take 10 mg by mouth daily.   Yes [provider]  azelastine (ASTELIN) 137 MCG/SPRAY nasal spray Place 2 sprays into the nose 2 (two) times daily as needed for rhinitis.   Yes [provider]  calcium carbonate-magnesium hydroxide (ROLAIDS) 334 MG CHEW Chew 1 tablet by mouth 2 (two) times daily as needed. For acid reflux   Yes [provider]  cetirizine (ZYRTEC) 10 MG tablet Take  10 mg by mouth at bedtime.   Yes [provider]  doxycycline (VIBRA-TABS) 100 MG tablet Take 100 mg by mouth 2 (two) times daily. 09/15/21  Yes [provider]  fluticasone (FLONASE) 50 MCG/ACT nasal spray Place 2 sprays into the nose daily.   Yes [provider]  meclizine (ANTIVERT) 25 MG tablet Take 25 mg by mouth 2 (two) times daily as needed for dizziness. 03/10/16  Yes [provider]  metoprolol succinate (TOPROL-XL) 50 MG 24 hr tablet Take 50 mg by mouth daily. 09/17/21  Yes [provider]  Multiple Vitamins-Minerals (PRESERVISION AREDS 2) CAPS Take 1 capsule by mouth 2 (two) times daily.   Yes [provider]  sertraline (ZOLOFT) 50 MG tablet Take 50 mg by mouth daily. 09/26/14  Yes [provider]    Allergies    Lisinopril  Review of Systems   Review of Systems  Constitutional:  Negative for appetite change.  HENT:   Negative for congestion.   Eyes:  Positive for photophobia.  Cardiovascular:  Negative for chest pain.  Gastrointestinal:  Negative for abdominal pain.  Genitourinary:  Negative for frequency.  Musculoskeletal:  Positive for neck pain.  Skin:  Positive for wound.  Neurological:  Positive for headaches.  Psychiatric/Behavioral:  Negative for confusion.    Physical Exam Updated Vital Signs BP (!) 151/89   Pulse (!) 58   Temp 97.8 F (36.6 C) (Oral)   Resp 19   Ht 5\' 9"  (1.753 m)   Wt 90.7 kg   SpO2 99%   BMI 29.53 kg/m   Physical Exam Vitals and nursing note reviewed.  Constitutional:      Appearance: He is well-developed.  HENT:     Head: Atraumatic.  Eyes:     Pupils: Pupils are equal, round, and reactive to light.  Cardiovascular:     Rate and Rhythm: Regular rhythm.  Pulmonary:     Effort: Pulmonary effort is normal.  Abdominal:     General: There is no distension.  Musculoskeletal:     Cervical back: Neck supple. No rigidity.  Skin:    General: Skin is warm.  Neurological:     Mental Status: He is alert.  Psychiatric:        Mood and Affect: Mood normal.  Left groin wound with some erythema.  No rash on hands.  No petechiae  ED Results / Procedures / Treatments   Labs (all labs ordered are listed, but only abnormal results are displayed) Labs Reviewed  COMPREHENSIVE METABOLIC PANEL - Abnormal; Notable for the following components:      Result Value   Creatinine, Ser 0.59 (*)    Total Protein 8.3 (*)    Albumin 5.2 (*)    All other components within normal limits  CBC WITH DIFFERENTIAL/PLATELET  ROCKY MTN SPOTTED FVR ABS PNL(IGG+IGM)    EKG None  Radiology CT HEAD WO CONTRAST (5MM)  Result Date: 09/19/2021 CLINICAL DATA:  55 year old male presenting with headache and recent tick bite. EXAM: CT HEAD WITHOUT CONTRAST TECHNIQUE: Contiguous axial images were obtained from the base of the skull through the vertex without intravenous contrast.  COMPARISON:  Comparison only with May sixteen of two 2000s sinus CT. FINDINGS: Brain: No evidence of acute infarction, hemorrhage, hydrocephalus, extra-axial collection or mass lesion/mass effect. Signs of mild atrophy. Vascular: No hyperdense vessel or unexpected calcification. Skull: Normal. Negative for fracture or focal lesion. Sinuses/Orbits: Visualized paranasal sinuses and orbits are unremarkable. Other: None. IMPRESSION: No acute intracranial  findings. Electronically Signed   By: Zetta Bills M.D.   On: 09/19/2021 16:27    Procedures Procedures   Medications Ordered in ED Medications - No data to display  ED Course  I have reviewed the triage vital signs and the nursing notes.  Pertinent labs & imaging results that were available during my care of the patient were reviewed by me and considered in my medical decision making (see chart for details).    MDM Rules/Calculators/A&P                           Patient with headache.  Recently diagnosed with [facility] spotted fever although only had positive IgG with 1 test.  Has had exposure with tick found in the Sunday with today being Thursday although has had numerous ticks recently.  Now developed a headache.  Discussed with infectious disease.  Recommends LP.  If negative can go home and takes doxycycline.  If positive for infection will require admission to the hospital with treatment with meningitis antibiotics and doxycycline.  Fluoroscopy-guided LP will be done.  Care turned over to Dr. Pearline Cables. Final Clinical Impression(s) / ED Diagnoses Final diagnoses:  Bad headache    Rx / DC Orders ED Discharge Orders     None        Davonna Belling, MD 09/19/21 1652

## 2021-09-19 NOTE — ED Provider Notes (Signed)
  Provider Note MRN:  [id number]  Arrival date & time: 09/19/21    ED Course and Medical Decision Making  Assumed care from Dr Alvino Chapel at shift change.  See not from prior team for complete details, in brief: pt with headache, history of recent tick bite, seen at [facility] and started on Doxycicline. IgG positive but negative IgM. In ED underwent CTH which was stable and LP which did not demonstrate meningitis. Prior Dr spoke with ED about planning, if negative LP then continue doxy. Will plan to discharge with medications for HA and advised pt to f/u with PCP and with ID as outpatient per prior physician plan.  The patient improved significantly and was discharged in stable condition. Detailed discussions were had with the patient regarding current findings, and need for close f/u with PCP or on call doctor. The patient has been instructed to return immediately if the symptoms worsen in any way for re-evaluation. Patient verbalized understanding and is in agreement with current care plan. All questions answered prior to discharge.    Procedures  Final Clinical Impressions(s) / ED Diagnoses     ICD-10-CM   1. Bad headache  R51.9 CANCELED: CSF cell count with differential    CANCELED: CSF culture    CANCELED: Protein and glucose, CSF      ED Discharge Orders          Ordered    CSF cell count with differential  Status:  Canceled        09/19/21 1807    CSF culture  Status:  Canceled        09/19/21 1807    Protein and glucose, CSF  Status:  Canceled        09/19/21 1807    butalbital-acetaminophen-caffeine (FIORICET) 50-325-40 MG tablet  Every 6 hours PRN        09/19/21 2131              Discharge Instructions        Your lumbar puncture did not demonstrate an infection  Please continue to take your doxycycline as prescribed  Take motrin or tylenol as needed for headache  Please follow up with infectious diseases and your PCP         Jeanell Sparrow, DO 09/19/21  2133

## 2021-09-19 NOTE — ED Notes (Signed)
Pt went to have Lumbar procedure done

## 2021-09-19 NOTE — ED Triage Notes (Signed)
Pt pulled tick off left upper thigh on Sunday with bullseye appearance at bite site.  Pt seen and tested at [facility] on Sunday for [place] fever, states results came back positive.  Pt is c/o severe HA states he was referred here.

## 2021-09-19 NOTE — Discharge Instructions (Addendum)
   Your lumbar puncture did not demonstrate an infection  Please continue to take your doxycycline as prescribed  Take motrin or tylenol as needed for headache  Please follow up with infectious diseases and your PCP

## 2021-09-23 LAB — CSF CULTURE W GRAM STAIN
Culture: NO GROWTH
Gram Stain: NONE SEEN

## 2021-09-24 LAB — ROCKY MTN SPOTTED FVR ABS PNL(IGG+IGM)
RMSF IgG: POSITIVE — AB
RMSF IgM: 0.43 index (ref 0.00–0.89)

## 2021-09-24 LAB — RMSF, IGG, IFA: RMSF, IGG, IFA: 1:64 {titer} — ABNORMAL HIGH

## 2022-06-02 IMAGING — CT CT HEAD W/O CM
3 series · 15 of 47 positions shown, 18 images · non-contrast
Comparison: Comparison only with May sixteen of two 2000s sinus CT.

CLINICAL DATA: 55-year-old male presenting with headache and recent
tick bite.

EXAM:
CT HEAD WITHOUT CONTRAST
TECHNIQUE: Contiguous axial images were obtained from the base of the skull
through the vertex without intravenous contrast.

[Series 2: head wo · axial · 0.47mm/px · z∈[-130,+5]mm · 9 of 33 slices shown, 12 images]
[im 3/33  brain]
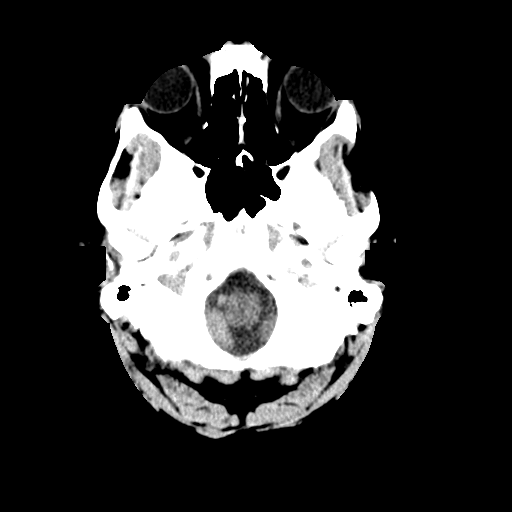
[im 3/33  bone]
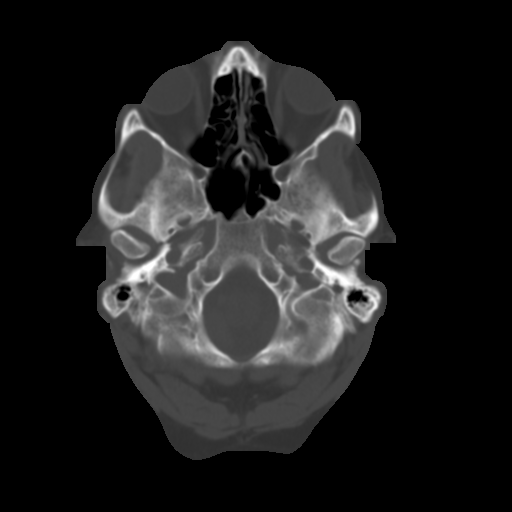
[im 6/33  brain]
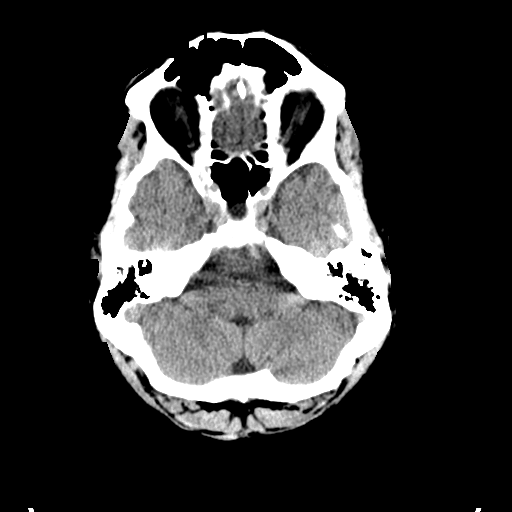
[im 9/33  brain]
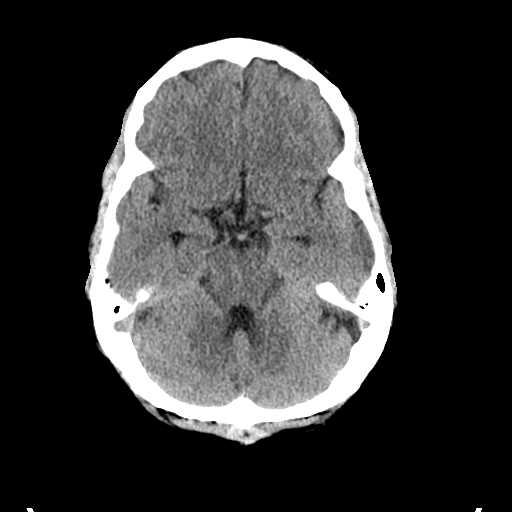
[im 13/33  brain]
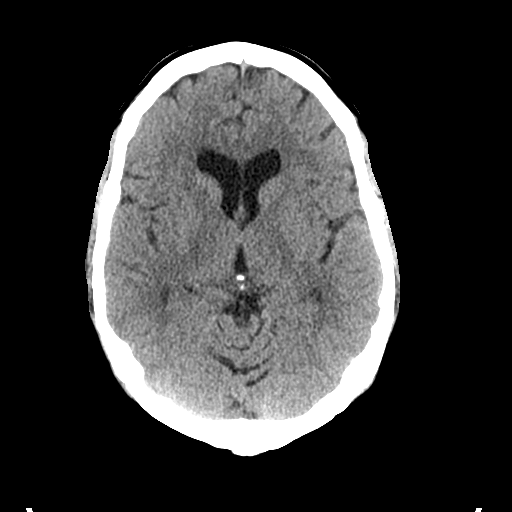
[im 17/33  brain]
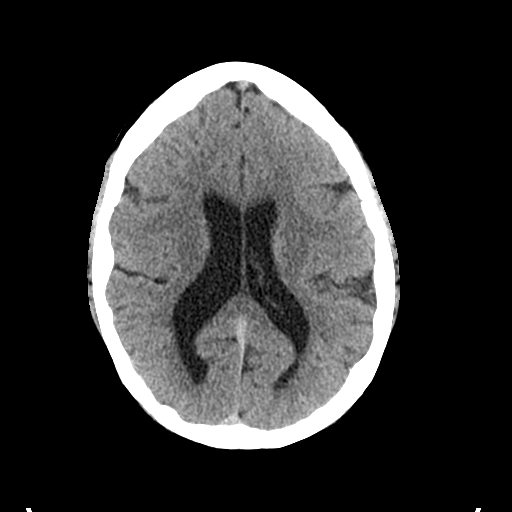
[im 17/33  bone]
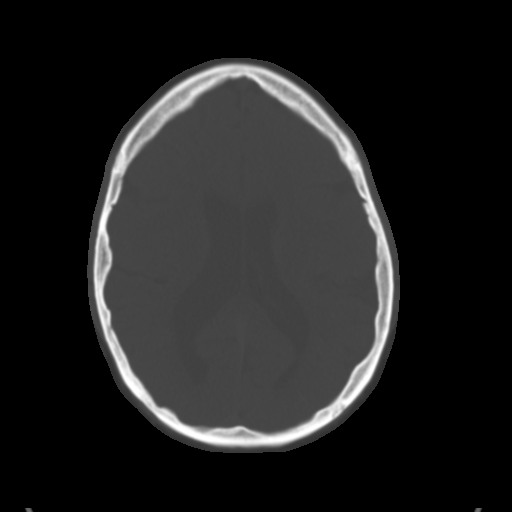
[im 20/33  brain]
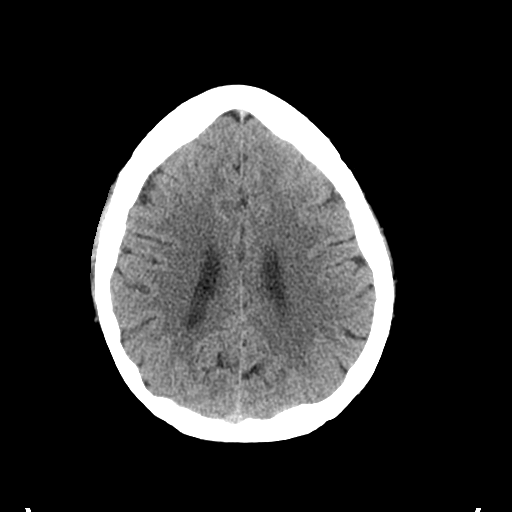
[im 24/33  brain]
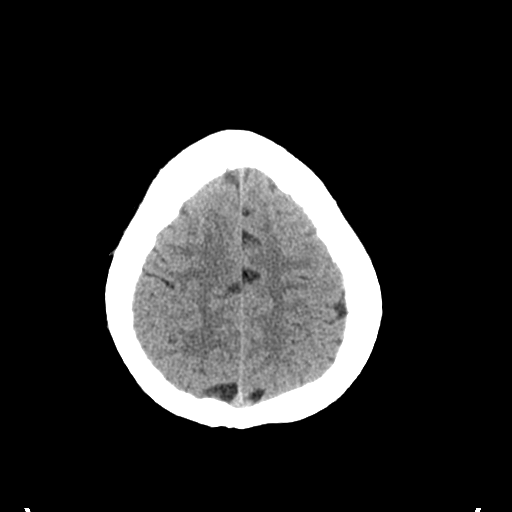
[im 27/33  brain]
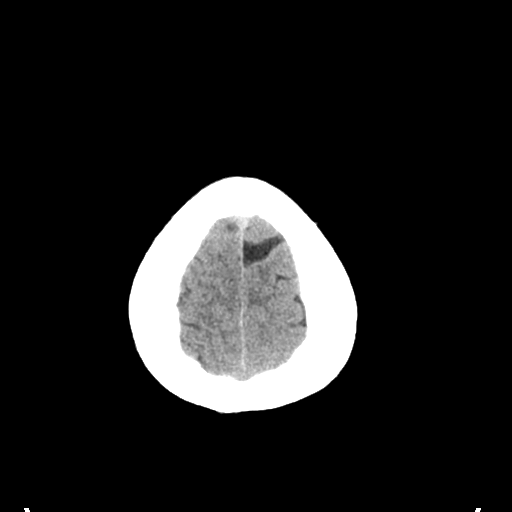
[im 30/33  brain]
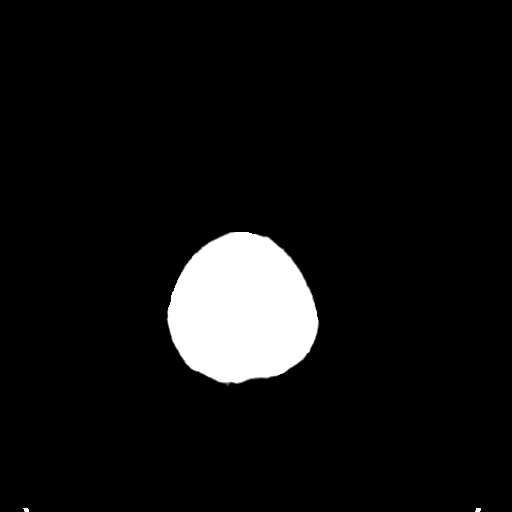
[im 30/33  bone]
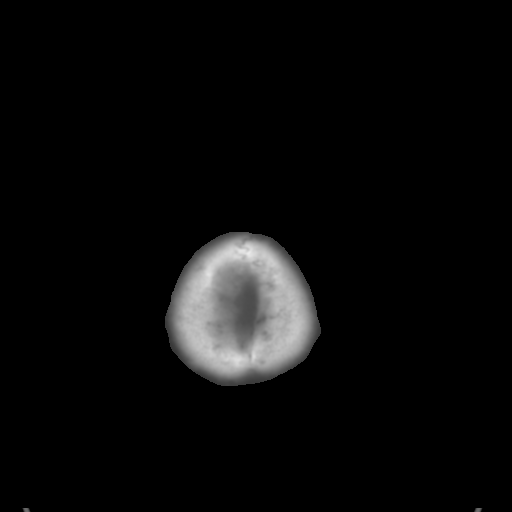

[Series 5: coronal soft tissue · coronal · 0.35mm/px · 3 of 78 slices shown]
[im 26/78  brain]
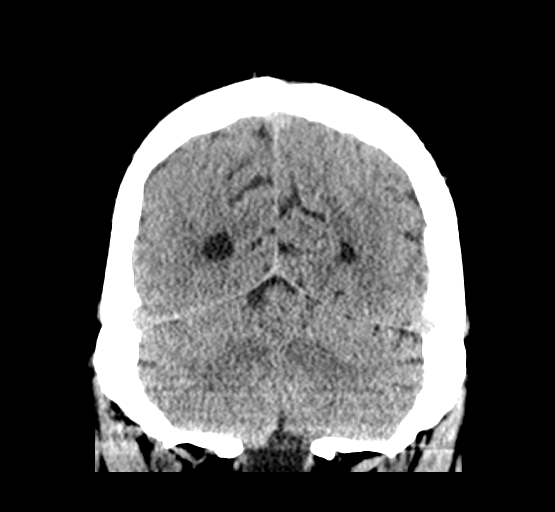
[im 35/78  brain]
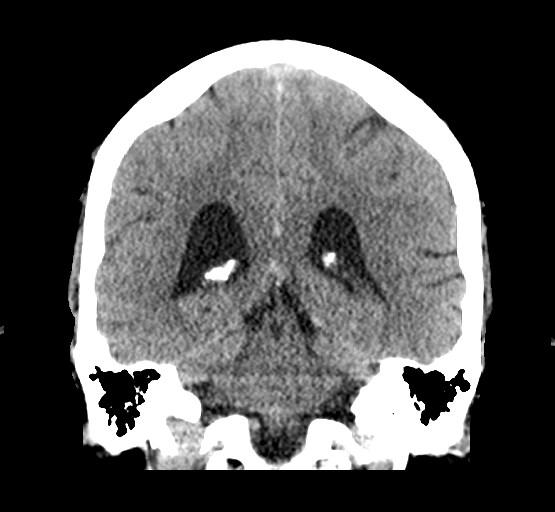
[im 43/78  brain]
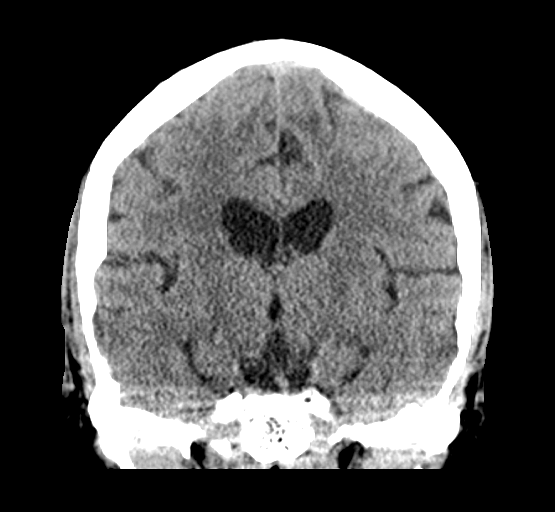

[Series 6: sagittal soft tissue · sagittal · 0.33mm/px · 3 of 71 slices shown]
[im 24/71  brain]
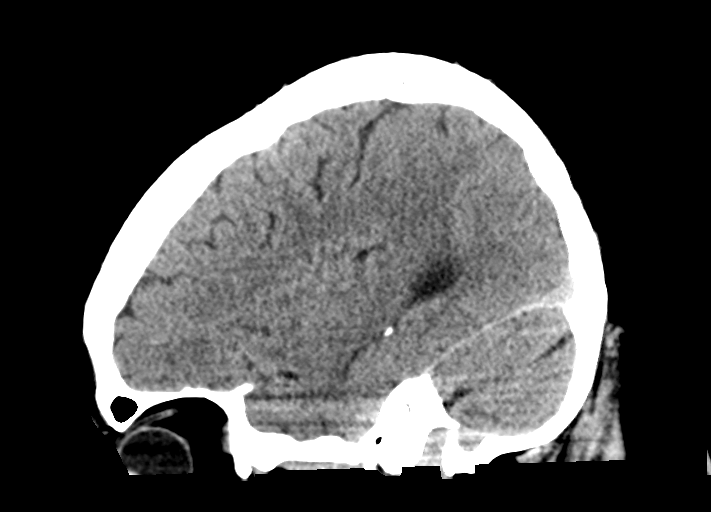
[im 36/71  brain]
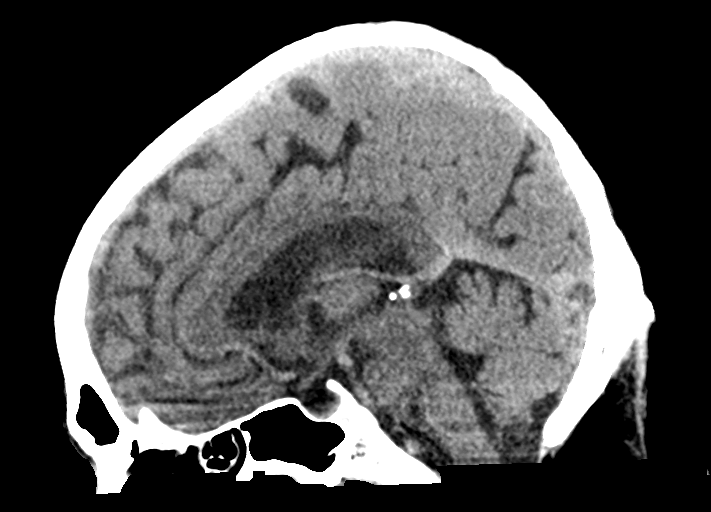
[im 47/71  brain]
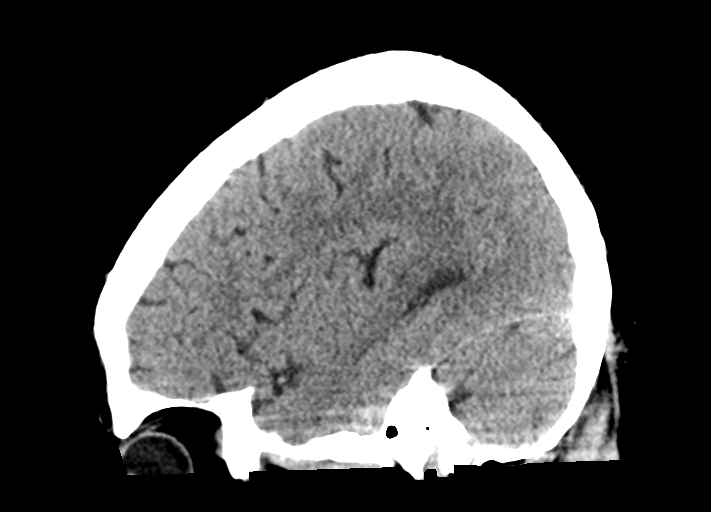

[15 of 47 positions shown; findings below may reference images not displayed]

FINDINGS: Brain: No evidence of acute infarction, hemorrhage, hydrocephalus,
extra-axial collection or mass lesion/mass effect. Signs of mild
atrophy.

Vascular: No hyperdense vessel or unexpected calcification.

Skull: Normal. Negative for fracture or focal lesion.

Sinuses/Orbits: Visualized paranasal sinuses and orbits are
unremarkable.

Other: None.
IMPRESSION: No acute intracranial findings.

## 2023-09-24 ENCOUNTER — Encounter: Payer: Self-pay | Admitting: Gastroenterology
